# Patient Record
Sex: Female | Born: 1955 | Race: Black or African American | Hispanic: No | Marital: Married | Smoking: Never smoker
Health system: Southern US, Community
[De-identification: ages and names within clinical notes are randomized; demographics above are authoritative.]

## PROBLEM LIST (undated history)

## (undated) DIAGNOSIS — K648 Other hemorrhoids: Secondary | ICD-10-CM

## (undated) DIAGNOSIS — K635 Polyp of colon: Secondary | ICD-10-CM

## (undated) DIAGNOSIS — E785 Hyperlipidemia, unspecified: Secondary | ICD-10-CM

## (undated) DIAGNOSIS — K602 Anal fissure, unspecified: Secondary | ICD-10-CM

## (undated) DIAGNOSIS — T7840XA Allergy, unspecified, initial encounter: Secondary | ICD-10-CM

## (undated) DIAGNOSIS — R0683 Snoring: Secondary | ICD-10-CM

## (undated) DIAGNOSIS — A159 Respiratory tuberculosis unspecified: Secondary | ICD-10-CM

## (undated) DIAGNOSIS — I10 Essential (primary) hypertension: Secondary | ICD-10-CM

## (undated) HISTORY — DX: Anal fissure, unspecified: K60.2

## (undated) HISTORY — PX: COLONOSCOPY: SHX174

## (undated) HISTORY — DX: Snoring: R06.83

## (undated) HISTORY — DX: Polyp of colon: K63.5

## (undated) HISTORY — DX: Essential (primary) hypertension: I10

## (undated) HISTORY — PX: ANAL FISSURE REPAIR: SHX2312

## (undated) HISTORY — PX: COLONOSCOPY W/ POLYPECTOMY: SHX1380

## (undated) HISTORY — DX: Other hemorrhoids: K64.8

## (undated) HISTORY — PX: POLYPECTOMY: SHX149

## (undated) HISTORY — PX: ABDOMINAL HYSTERECTOMY: SHX81

## (undated) HISTORY — DX: Respiratory tuberculosis unspecified: A15.9

## (undated) HISTORY — DX: Allergy, unspecified, initial encounter: T78.40XA

## (undated) HISTORY — DX: Hyperlipidemia, unspecified: E78.5

---

## 1978-12-30 DIAGNOSIS — A159 Respiratory tuberculosis unspecified: Secondary | ICD-10-CM

## 1978-12-30 HISTORY — DX: Respiratory tuberculosis unspecified: A15.9

## 1999-03-30 ENCOUNTER — Other Ambulatory Visit: Admission: RE | Admit: 1999-03-30 | Discharge: 1999-03-30 | Payer: Self-pay | Admitting: Family Medicine

## 2000-12-24 ENCOUNTER — Other Ambulatory Visit: Admission: RE | Admit: 2000-12-24 | Discharge: 2000-12-24 | Payer: Self-pay | Admitting: Internal Medicine

## 2000-12-27 ENCOUNTER — Emergency Department (HOSPITAL_COMMUNITY): Admission: EM | Admit: 2000-12-27 | Discharge: 2000-12-28 | Payer: Self-pay | Admitting: Emergency Medicine

## 2001-07-06 ENCOUNTER — Ambulatory Visit (HOSPITAL_COMMUNITY): Admission: RE | Admit: 2001-07-06 | Discharge: 2001-07-06 | Payer: Self-pay | Admitting: Obstetrics and Gynecology

## 2001-07-06 ENCOUNTER — Encounter: Payer: Self-pay | Admitting: Obstetrics and Gynecology

## 2002-07-03 ENCOUNTER — Emergency Department (HOSPITAL_COMMUNITY): Admission: EM | Admit: 2002-07-03 | Discharge: 2002-07-04 | Payer: Self-pay | Admitting: Emergency Medicine

## 2002-12-30 DIAGNOSIS — K635 Polyp of colon: Secondary | ICD-10-CM

## 2002-12-30 HISTORY — DX: Polyp of colon: K63.5

## 2003-05-02 ENCOUNTER — Other Ambulatory Visit: Admission: RE | Admit: 2003-05-02 | Discharge: 2003-05-02 | Payer: Self-pay | Admitting: Family Medicine

## 2003-05-05 ENCOUNTER — Encounter: Admission: RE | Admit: 2003-05-05 | Discharge: 2003-05-05 | Payer: Self-pay | Admitting: Family Medicine

## 2003-05-05 ENCOUNTER — Encounter: Payer: Self-pay | Admitting: Family Medicine

## 2003-06-24 ENCOUNTER — Inpatient Hospital Stay (HOSPITAL_COMMUNITY): Admission: RE | Admit: 2003-06-24 | Discharge: 2003-06-26 | Payer: Self-pay | Admitting: Obstetrics and Gynecology

## 2003-06-24 ENCOUNTER — Encounter (INDEPENDENT_AMBULATORY_CARE_PROVIDER_SITE_OTHER): Payer: Self-pay | Admitting: *Deleted

## 2004-05-25 ENCOUNTER — Ambulatory Visit (HOSPITAL_COMMUNITY): Admission: RE | Admit: 2004-05-25 | Discharge: 2004-05-25 | Payer: Self-pay | Admitting: Family Medicine

## 2004-08-14 ENCOUNTER — Other Ambulatory Visit: Admission: RE | Admit: 2004-08-14 | Discharge: 2004-08-14 | Payer: Self-pay | Admitting: Obstetrics and Gynecology

## 2004-12-03 ENCOUNTER — Ambulatory Visit: Payer: Self-pay | Admitting: Family Medicine

## 2005-12-06 ENCOUNTER — Ambulatory Visit (HOSPITAL_COMMUNITY): Admission: RE | Admit: 2005-12-06 | Discharge: 2005-12-06 | Payer: Self-pay | Admitting: Family Medicine

## 2005-12-10 ENCOUNTER — Ambulatory Visit: Payer: Self-pay | Admitting: Family Medicine

## 2005-12-10 ENCOUNTER — Encounter: Payer: Self-pay | Admitting: Family Medicine

## 2005-12-10 ENCOUNTER — Other Ambulatory Visit: Admission: RE | Admit: 2005-12-10 | Discharge: 2005-12-10 | Payer: Self-pay | Admitting: Family Medicine

## 2006-01-01 ENCOUNTER — Ambulatory Visit: Payer: Self-pay | Admitting: Internal Medicine

## 2007-02-13 ENCOUNTER — Ambulatory Visit: Payer: Self-pay | Admitting: Family Medicine

## 2007-02-13 LAB — CONVERTED CEMR LAB
Albumin: 4.1 g/dL (ref 3.5–5.2)
Basophils Absolute: 0 10*3/uL (ref 0.0–0.1)
Creatinine, Ser: 0.6 mg/dL (ref 0.4–1.2)
Eosinophils Absolute: 0.2 10*3/uL (ref 0.0–0.6)
GFR calc Af Amer: 136 mL/min
GFR calc non Af Amer: 112 mL/min
HCT: 40 % (ref 36.0–46.0)
Hemoglobin: 13.6 g/dL (ref 12.0–15.0)
Hgb A1c MFr Bld: 6.2 % — ABNORMAL HIGH (ref 4.6–6.0)
Lymphocytes Relative: 41.9 % (ref 12.0–46.0)
MCHC: 34.1 g/dL (ref 30.0–36.0)
MCV: 84.8 fL (ref 78.0–100.0)
Monocytes Absolute: 0.5 10*3/uL (ref 0.2–0.7)
Neutro Abs: 2.1 10*3/uL (ref 1.4–7.7)
Neutrophils Relative %: 44.3 % (ref 43.0–77.0)
Potassium: 3.7 meq/L (ref 3.5–5.1)
Sodium: 141 meq/L (ref 135–145)
TSH: 1.12 microintl units/mL (ref 0.35–5.50)
Total Bilirubin: 0.7 mg/dL (ref 0.3–1.2)

## 2007-02-17 ENCOUNTER — Encounter: Payer: Self-pay | Admitting: Family Medicine

## 2007-02-24 ENCOUNTER — Ambulatory Visit: Payer: Self-pay

## 2007-02-27 ENCOUNTER — Ambulatory Visit: Payer: Self-pay | Admitting: Family Medicine

## 2007-05-04 ENCOUNTER — Ambulatory Visit: Payer: Self-pay | Admitting: Family Medicine

## 2007-05-04 LAB — CONVERTED CEMR LAB
AST: 35 units/L (ref 0–37)
Albumin: 3.6 g/dL (ref 3.5–5.2)
BUN: 10 mg/dL (ref 6–23)
Chloride: 104 meq/L (ref 96–112)
GFR calc non Af Amer: 94 mL/min
Hgb A1c MFr Bld: 6.3 % — ABNORMAL HIGH (ref 4.6–6.0)
Sodium: 139 meq/L (ref 135–145)

## 2007-08-03 ENCOUNTER — Ambulatory Visit: Payer: Self-pay | Admitting: Family Medicine

## 2007-08-10 ENCOUNTER — Encounter: Payer: Self-pay | Admitting: Family Medicine

## 2007-08-14 LAB — CONVERTED CEMR LAB
AST: 54 units/L — ABNORMAL HIGH (ref 0–37)
Albumin: 3.7 g/dL (ref 3.5–5.2)
Alkaline Phosphatase: 94 units/L (ref 39–117)
BUN: 13 mg/dL (ref 6–23)
Chloride: 107 meq/L (ref 96–112)
Creatinine, Ser: 0.7 mg/dL (ref 0.4–1.2)
Total Bilirubin: 0.8 mg/dL (ref 0.3–1.2)

## 2008-02-10 ENCOUNTER — Telehealth (INDEPENDENT_AMBULATORY_CARE_PROVIDER_SITE_OTHER): Payer: Self-pay | Admitting: *Deleted

## 2008-03-09 ENCOUNTER — Telehealth: Payer: Self-pay | Admitting: Family Medicine

## 2008-06-10 ENCOUNTER — Ambulatory Visit (HOSPITAL_COMMUNITY): Admission: RE | Admit: 2008-06-10 | Discharge: 2008-06-10 | Payer: Self-pay | Admitting: Family Medicine

## 2008-06-24 ENCOUNTER — Encounter: Payer: Self-pay | Admitting: Family Medicine

## 2008-06-24 ENCOUNTER — Encounter: Payer: Self-pay | Admitting: Internal Medicine

## 2008-06-24 ENCOUNTER — Other Ambulatory Visit: Admission: RE | Admit: 2008-06-24 | Discharge: 2008-06-24 | Payer: Self-pay | Admitting: Family Medicine

## 2008-06-24 ENCOUNTER — Ambulatory Visit: Payer: Self-pay | Admitting: Family Medicine

## 2008-06-24 DIAGNOSIS — E785 Hyperlipidemia, unspecified: Secondary | ICD-10-CM

## 2008-06-24 DIAGNOSIS — K921 Melena: Secondary | ICD-10-CM | POA: Insufficient documentation

## 2008-06-27 ENCOUNTER — Encounter (INDEPENDENT_AMBULATORY_CARE_PROVIDER_SITE_OTHER): Payer: Self-pay | Admitting: *Deleted

## 2008-06-28 ENCOUNTER — Encounter: Payer: Self-pay | Admitting: Family Medicine

## 2008-06-29 ENCOUNTER — Encounter (INDEPENDENT_AMBULATORY_CARE_PROVIDER_SITE_OTHER): Payer: Self-pay | Admitting: *Deleted

## 2008-06-30 ENCOUNTER — Ambulatory Visit: Payer: Self-pay | Admitting: Family Medicine

## 2008-06-30 ENCOUNTER — Telehealth (INDEPENDENT_AMBULATORY_CARE_PROVIDER_SITE_OTHER): Payer: Self-pay | Admitting: *Deleted

## 2008-06-30 LAB — CONVERTED CEMR LAB: OCCULT 3: NEGATIVE

## 2008-07-04 ENCOUNTER — Encounter (INDEPENDENT_AMBULATORY_CARE_PROVIDER_SITE_OTHER): Payer: Self-pay | Admitting: *Deleted

## 2008-10-04 ENCOUNTER — Telehealth (INDEPENDENT_AMBULATORY_CARE_PROVIDER_SITE_OTHER): Payer: Self-pay | Admitting: *Deleted

## 2008-10-13 ENCOUNTER — Ambulatory Visit: Payer: Self-pay | Admitting: Family Medicine

## 2008-10-14 ENCOUNTER — Encounter (INDEPENDENT_AMBULATORY_CARE_PROVIDER_SITE_OTHER): Payer: Self-pay | Admitting: *Deleted

## 2008-10-31 ENCOUNTER — Encounter (INDEPENDENT_AMBULATORY_CARE_PROVIDER_SITE_OTHER): Payer: Self-pay | Admitting: *Deleted

## 2009-01-23 ENCOUNTER — Ambulatory Visit: Payer: Self-pay | Admitting: Family Medicine

## 2009-01-24 ENCOUNTER — Encounter: Payer: Self-pay | Admitting: Family Medicine

## 2009-01-25 ENCOUNTER — Encounter (INDEPENDENT_AMBULATORY_CARE_PROVIDER_SITE_OTHER): Payer: Self-pay | Admitting: *Deleted

## 2009-03-24 ENCOUNTER — Telehealth (INDEPENDENT_AMBULATORY_CARE_PROVIDER_SITE_OTHER): Payer: Self-pay | Admitting: *Deleted

## 2009-06-02 ENCOUNTER — Ambulatory Visit: Payer: Self-pay | Admitting: Family Medicine

## 2009-06-07 ENCOUNTER — Encounter (INDEPENDENT_AMBULATORY_CARE_PROVIDER_SITE_OTHER): Payer: Self-pay | Admitting: *Deleted

## 2009-06-12 ENCOUNTER — Encounter (INDEPENDENT_AMBULATORY_CARE_PROVIDER_SITE_OTHER): Payer: Self-pay | Admitting: *Deleted

## 2009-06-16 ENCOUNTER — Ambulatory Visit: Payer: Self-pay | Admitting: Family Medicine

## 2009-06-19 ENCOUNTER — Encounter (INDEPENDENT_AMBULATORY_CARE_PROVIDER_SITE_OTHER): Payer: Self-pay | Admitting: *Deleted

## 2009-10-06 ENCOUNTER — Ambulatory Visit: Payer: Self-pay | Admitting: Family Medicine

## 2009-10-09 ENCOUNTER — Encounter: Payer: Self-pay | Admitting: Family Medicine

## 2009-10-13 ENCOUNTER — Ambulatory Visit: Payer: Self-pay | Admitting: Family Medicine

## 2009-10-25 ENCOUNTER — Telehealth (INDEPENDENT_AMBULATORY_CARE_PROVIDER_SITE_OTHER): Payer: Self-pay | Admitting: *Deleted

## 2010-01-26 ENCOUNTER — Ambulatory Visit: Payer: Self-pay | Admitting: Family Medicine

## 2010-01-26 ENCOUNTER — Ambulatory Visit (HOSPITAL_COMMUNITY): Admission: RE | Admit: 2010-01-26 | Discharge: 2010-01-26 | Payer: Self-pay | Admitting: Family Medicine

## 2010-01-29 ENCOUNTER — Telehealth (INDEPENDENT_AMBULATORY_CARE_PROVIDER_SITE_OTHER): Payer: Self-pay | Admitting: *Deleted

## 2010-01-30 ENCOUNTER — Encounter: Payer: Self-pay | Admitting: Family Medicine

## 2010-04-27 ENCOUNTER — Encounter (INDEPENDENT_AMBULATORY_CARE_PROVIDER_SITE_OTHER): Payer: Self-pay | Admitting: *Deleted

## 2010-06-15 ENCOUNTER — Ambulatory Visit: Payer: Self-pay | Admitting: Family Medicine

## 2010-06-15 DIAGNOSIS — M899 Disorder of bone, unspecified: Secondary | ICD-10-CM | POA: Insufficient documentation

## 2010-06-15 DIAGNOSIS — M949 Disorder of cartilage, unspecified: Secondary | ICD-10-CM

## 2010-08-17 ENCOUNTER — Encounter: Payer: Self-pay | Admitting: Family Medicine

## 2010-10-05 ENCOUNTER — Ambulatory Visit: Payer: Self-pay | Admitting: Family Medicine

## 2010-10-10 ENCOUNTER — Telehealth: Payer: Self-pay | Admitting: Family Medicine

## 2010-10-10 LAB — CONVERTED CEMR LAB
ALT: 15 units/L (ref 0–35)
Albumin: 3.9 g/dL (ref 3.5–5.2)
Alkaline Phosphatase: 60 units/L (ref 39–117)
Cholesterol: 113 mg/dL (ref 0–200)
LDL Cholesterol: 57 mg/dL (ref 0–99)
Total Protein: 7.5 g/dL (ref 6.0–8.3)
Triglycerides: 99 mg/dL (ref 0.0–149.0)

## 2010-11-26 ENCOUNTER — Encounter: Payer: Self-pay | Admitting: Family Medicine

## 2010-11-30 ENCOUNTER — Telehealth (INDEPENDENT_AMBULATORY_CARE_PROVIDER_SITE_OTHER): Payer: Self-pay | Admitting: *Deleted

## 2011-01-27 LAB — CONVERTED CEMR LAB
ALT: 17 units/L (ref 0–35)
ALT: 20 units/L (ref 0–35)
ALT: 20 units/L (ref 0–35)
ALT: 22 units/L (ref 0–35)
AST: 22 units/L (ref 0–37)
AST: 24 units/L (ref 0–37)
AST: 26 units/L (ref 0–37)
Albumin: 3.7 g/dL (ref 3.5–5.2)
Albumin: 3.9 g/dL (ref 3.5–5.2)
Albumin: 3.9 g/dL (ref 3.5–5.2)
Alkaline Phosphatase: 60 units/L (ref 39–117)
BUN: 13 mg/dL (ref 6–23)
Basophils Absolute: 0 10*3/uL (ref 0.0–0.1)
Basophils Relative: 0.3 % (ref 0.0–3.0)
Basophils Relative: 0.3 % (ref 0.0–3.0)
Basophils Relative: 0.9 % (ref 0.0–1.0)
Bilirubin Urine: NEGATIVE
Bilirubin Urine: NEGATIVE
Bilirubin, Direct: 0.1 mg/dL (ref 0.0–0.3)
Bilirubin, Direct: 0.1 mg/dL (ref 0.0–0.3)
Bilirubin, Direct: 0.1 mg/dL (ref 0.0–0.3)
Bilirubin, Direct: 0.2 mg/dL (ref 0.0–0.3)
Blood in Urine, dipstick: NEGATIVE
CO2: 28 meq/L (ref 19–32)
CO2: 31 meq/L (ref 19–32)
CO2: 31 meq/L (ref 19–32)
CRP, High Sensitivity: 2 (ref 0.00–5.00)
Calcium: 8.8 mg/dL (ref 8.4–10.5)
Calcium: 9.2 mg/dL (ref 8.4–10.5)
Chloride: 102 meq/L (ref 96–112)
Chloride: 104 meq/L (ref 96–112)
Chloride: 106 meq/L (ref 96–112)
Cholesterol: 107 mg/dL (ref 0–200)
Creatinine, Ser: 0.8 mg/dL (ref 0.4–1.2)
Creatinine, Ser: 0.8 mg/dL (ref 0.4–1.2)
Eosinophils Absolute: 0.1 10*3/uL (ref 0.0–0.7)
Eosinophils Absolute: 0.1 10*3/uL (ref 0.0–0.7)
Eosinophils Relative: 2.5 % (ref 0.0–5.0)
Glucose, Bld: 100 mg/dL — ABNORMAL HIGH (ref 70–99)
Glucose, Bld: 124 mg/dL — ABNORMAL HIGH (ref 70–99)
Glucose, Urine, Semiquant: NEGATIVE
HCT: 38.9 % (ref 36.0–46.0)
Hemoglobin: 13 g/dL (ref 12.0–15.0)
Hemoglobin: 13.2 g/dL (ref 12.0–15.0)
Ketones, urine, test strip: NEGATIVE
Ketones, urine, test strip: NEGATIVE
Lymphocytes Relative: 39 % (ref 12.0–46.0)
Lymphocytes Relative: 40.8 % (ref 12.0–46.0)
MCHC: 33.4 g/dL (ref 30.0–36.0)
MCHC: 33.4 g/dL (ref 30.0–36.0)
MCHC: 33.6 g/dL (ref 30.0–36.0)
MCV: 86.5 fL (ref 78.0–100.0)
MCV: 88.2 fL (ref 78.0–100.0)
Monocytes Absolute: 0.7 10*3/uL (ref 0.1–1.0)
Monocytes Relative: 13.5 % — ABNORMAL HIGH (ref 3.0–12.0)
Neutro Abs: 1.8 10*3/uL (ref 1.4–7.7)
Neutro Abs: 2.2 10*3/uL (ref 1.4–7.7)
Neutrophils Relative %: 34.6 % — ABNORMAL LOW (ref 43.0–77.0)
Neutrophils Relative %: 44.1 % (ref 43.0–77.0)
Platelets: 161 10*3/uL (ref 150.0–400.0)
Potassium: 3.7 meq/L (ref 3.5–5.1)
Protein, U semiquant: NEGATIVE
RBC: 4.49 M/uL (ref 3.87–5.11)
RBC: 4.59 M/uL (ref 3.87–5.11)
Sodium: 140 meq/L (ref 135–145)
Specific Gravity, Urine: 1.015
TSH: 1.37 microintl units/mL (ref 0.35–5.50)
Total Bilirubin: 0.7 mg/dL (ref 0.3–1.2)
Total Bilirubin: 0.8 mg/dL (ref 0.3–1.2)
Total Bilirubin: 0.8 mg/dL (ref 0.3–1.2)
Total Bilirubin: 0.9 mg/dL (ref 0.3–1.2)
Total Protein: 7.4 g/dL (ref 6.0–8.3)
Total Protein: 7.4 g/dL (ref 6.0–8.3)
Total Protein: 7.7 g/dL (ref 6.0–8.3)
Total Protein: 7.8 g/dL (ref 6.0–8.3)
Triglycerides: 79 mg/dL (ref 0.0–149.0)
Urobilinogen, UA: 0.2
pH: 5
pH: 6

## 2011-01-29 NOTE — Medication Information (Signed)
Summary: Nonadherence with Crestor/[employer]  Nonadherence with Crestor/[employer]   Imported By: Lanelle Bal 09/04/2010 11:25:07  _____________________________________________________________________  External Attachment:    Type:   Image     Comment:   External Document

## 2011-01-29 NOTE — Progress Notes (Signed)
Summary: -Prior Auth required alternative med  Phone Note Refill Request   Refills Requested: Medication #1:  CRESTOR 10 MG TABS Take 1 tab at bedtime  Prior Auth  required for crestor. Pt must have tried and failed: lovastatin, pravastatin, simvastatin or lipitor in order for insurance to pay for Crestor. Pls advise...............Marland KitchenFelecia Deloach CMA  November 30, 2010 8:59 AM    Follow-up for Phone Call        change to Lipitor 10 mg #30  1 by mouth at bedtime  2 refills Follow-up by: Loreen Freud DO,  November 30, 2010 11:34 AM  Additional Follow-up for Phone Call Additional follow up Details #1::        left message to call office...........Marland KitchenFelecia Deloach CMA  November 30, 2010 12:08 PM     Additional Follow-up for Phone Call Additional follow up Details #2::    Pt aware.............Marland KitchenFelecia Deloach CMA  November 30, 2010 12:50 PM   New/Updated Medications: LIPITOR 10 MG TABS (ATORVASTATIN CALCIUM) Take 1 by mouth at bedtime NIASPAN 500 MG CR-TABS (NIACIN (ANTIHYPERLIPIDEMIC)) Take 1 tab once daily Prescriptions: NIASPAN 500 MG CR-TABS (NIACIN (ANTIHYPERLIPIDEMIC)) Take 1 tab once daily  #30 x 2   Entered by:   Jeremy Johann CMA   Authorized by:   Loreen Freud DO   Signed by:   Jeremy Johann CMA on 11/30/2010   Method used:   Faxed to ...       [facility]  [address] [id number]* (retail)       [address]       [place], [place]  [address]       Ph: [phone] or [phone]       Fax: [phone]   RxID:   [id number] LIPITOR 10 MG TABS (ATORVASTATIN CALCIUM) Take 1 by mouth at bedtime  #30 x 2   Entered by:   Jeremy Johann CMA   Authorized by:   Loreen Freud DO   Signed by:   Jeremy Johann CMA on 11/30/2010   Method used:   Faxed to ...       [facility]  [address] [id number]* (retail)       [address]       [place], [place]  [address]       Ph: [phone] or [phone]       Fax: [phone]   RxID:    [id number]

## 2011-01-29 NOTE — Letter (Signed)
Summary: Colonoscopy Date Change Letter  [facility]  [address] [place], [place] [address]   Phone: [phone]  Fax: [phone]      April 27, 2010 MRN: [id number]   Joann Townsend [address] [place], [place]  [address]   Dear Ms. Aguallo,   Previously you were recommended to have a repeat colonoscopy around this time. Your chart was recently reviewed by Dr. Judie Petit T. Russella Dar of [facility]. Follow up colonoscopy is now recommended in May 2014. This revised recommendation is based on current, nationally recognized guidelines for colorectal cancer screening and polyp surveillance. These guidelines are endorsed by the American Cancer Society, [employer] on Colorectal Cancer as well as numerous other major medical organizations.  Please understand that our recommendation assumes that you do not have any new symptoms such as bleeding, a change in bowel habits, anemia, or significant abdominal discomfort. If you do have any concerning GI symptoms or want to discuss the guideline recommendations, please call to arrange an office visit at your earliest convenience. Otherwise we will keep you in our reminder system and contact you 1-2 months prior to the date listed above to schedule your next colonoscopy.  Thank you,  Judie Petit T. Russella Dar, M.D.  [facility] [phone]

## 2011-01-29 NOTE — Progress Notes (Signed)
Summary: Imaging Results   Phone Note Outgoing Call   Call placed by: Army Fossa CMA,  January 29, 2010 8:51 AM Summary of Call: Regarding Imaging Results, LMTCB:  no acute findings Signed by Loreen Freud DO on 01/26/2010 at 5:33 PM  Follow-up for Phone Call        pt is aware. Army Fossa CMA  January 29, 2010 4:20 PM

## 2011-01-29 NOTE — Assessment & Plan Note (Signed)
Summary: CPX AND FASTING LABS///[facility]   Vital Signs:  Patient profile:   55 year old female Height:      60 inches Weight:      164.50 pounds BMI:     32.24 Pulse rate:   76 / minute Pulse rhythm:   regular BP sitting:   122 / 82  (left arm) Cuff size:   regular  Vitals Entered By: Army Fossa CMA (June 15, 2010 8:34 AM) CC: Pt here for CPX, pt is fasting   History of Present Illness: Pt here for cpe and labs.   No complaints.    Preventive Screening-Counseling & Management  Alcohol-Tobacco     Alcohol drinks/day: 0     Smoking Status: never  Caffeine-Diet-Exercise     Caffeine use/day: 0     Does Patient Exercise: yes     Type of exercise: treadmill---not consistant     Exercise (avg: min/session): 30-60     Times/week: 3     Exercise Counseling: to improve exercise regimen  Hep-HIV-STD-Contraception     Dental Visit-last 6 months no     Dental Care Counseling: to seek dental care; no dental care within six months     SBE monthly: yes     SBE Education/Counseling: to perform regular SBE  Safety-Violence-Falls     Seat Belt Use: yes     Seat Belt Counseling: not indicated; patient wears seat belts     Firearms in the Home: no firearms in the home     Firearm Counseling: not applicable     Smoke Detectors: yes     Violence in the Home: no risk noted     Sexual Abuse: no      Sexual History:  currently monogamous.        Drug Use:  never.    Current Medications (verified): 1)  Climara 0.075 Mg/24hr  Ptwk (Estradiol) .... Apply 1 Patch Weekly 2)  Crestor 20 Mg  Tabs (Rosuvastatin Calcium) .... Take One and 1/2  Tablet Each Evening At Bedtime. 3)  Niaspan 500 Mg Cr-Tabs (Niacin (Antihyperlipidemic)) .Marland Kitchen.. 1 By Mouth At Bedtime. 4)  Niaspan 1000 Mg Cr-Tabs (Niacin (Antihyperlipidemic)) .Marland Kitchen.. 1 By Mouth Once Daily.  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 mother-htn brother-mother-dm Family History Diabetes 1st degree relative Family History Hypertension  Social History: Reviewed history from 06/24/2008 and no changes required. 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 Eyes:  Complains of blurring; denies discharge, double vision, eye irritation, eye pain, halos, itching, light sensitivity, red eye, vision loss-1 eye, and vision loss-both eyes; ophth-- q1y. ENT:  Denies decreased hearing, difficulty swallowing, ear discharge, earache, hoarseness, nasal congestion, nosebleeds, postnasal  drainage, ringing in ears, sinus pressure, and sore throat. CV:  Denies bluish discoloration of lips or nails, chest pain or discomfort, difficulty breathing at night, difficulty breathing while lying down, fainting, fatigue, leg cramps with exertion, lightheadness, near fainting, palpitations, shortness of breath with exertion, swelling of feet, swelling of hands, and weight gain. Resp:  Denies chest discomfort, chest pain with  inspiration, cough, coughing up blood, excessive snoring, hypersomnolence, morning headaches, pleuritic, shortness of breath, sputum productive, and wheezing. GI:  Denies abdominal pain, bloody stools, change in bowel habits, constipation, dark tarry stools, diarrhea, excessive appetite, gas, hemorrhoids, indigestion, loss of appetite, nausea, vomiting, vomiting blood, and yellowish skin color. GU:  Denies abnormal vaginal bleeding, decreased libido, discharge, dysuria, genital sores, hematuria, incontinence, nocturia, urinary frequency, and urinary hesitancy. MS:  Denies joint pain, joint redness, joint swelling, loss of strength, low back pain, mid back pain, muscle aches, muscle , cramps, muscle weakness, stiffness, and thoracic pain. Derm:  Denies changes in color of skin, changes in nail beds, dryness, excessive perspiration, flushing, hair loss, insect bite(s), itching, lesion(s), poor wound healing, and rash. Neuro:  Denies brief paralysis, difficulty with concentration, disturbances in coordination, falling down, headaches, inability to speak, memory loss, numbness, poor balance, seizures, sensation of room spinning, tingling, tremors, visual disturbances, and weakness. Psych:  Denies alternate hallucination ( auditory/visual), anxiety, depression, easily angered, easily tearful, irritability, mental problems, panic attacks, sense of great danger, suicidal thoughts/plans, thoughts of violence, unusual visions or sounds, and thoughts /plans of harming others. Endo:  Denies cold intolerance, excessive hunger, excessive thirst, excessive urination, heat intolerance, polyuria, and weight change. Heme:  Denies abnormal bruising, bleeding, enlarge lymph nodes, fevers, pallor, and skin discoloration. Allergy:  Denies hives or rash, itching eyes, persistent infections, seasonal allergies, and sneezing.  Physical Exam  General:  Well-developed,well-nourished,in no acute distress; alert,appropriate and  cooperative throughout examination Head:  Normocephalic and atraumatic without obvious abnormalities. No apparent alopecia or balding. Eyes:  vision grossly intact, pupils equal, pupils round, pupils reactive to light, and no injection.   Ears:  External ear exam shows no significant lesions or deformities.  Otoscopic examination reveals clear canals, tympanic membranes are intact bilaterally without bulging, retraction, inflammation or discharge. Hearing is grossly normal bilaterally. Nose:  External nasal examination shows no deformity or inflammation. Nasal mucosa are pink and moist without lesions or exudates. Mouth:  Oral mucosa and oropharynx without lesions or exudates.  Teeth in good repair. Neck:  No deformities, masses, or tenderness noted.no carotid bruits.   Chest Wall:  No deformities, masses, or tenderness noted. Lungs:  Normal respiratory effort, chest expands symmetrically. Lungs are clear to auscultation, no crackles or wheezes. Heart:  normal rate and no murmur.   Abdomen:  Bowel sounds positive,abdomen soft and non-tender without masses, organomegaly or hernias noted. Rectal:  deferred Genitalia:  pap deferred --TAH Msk:  normal ROM, no joint tenderness, no joint swelling, no joint warmth, no redness over joints, no joint deformities, no joint instability, and no crepitation.   Pulses:  R posterior tibial normal, R dorsalis pedis normal, R carotid normal, L posterior tibial normal, L dorsalis pedis normal, and L carotid normal.   Extremities:  No clubbing, cyanosis, edema, or deformity noted with normal full range of motion of all joints.   Neurologic:  No cranial nerve deficits noted. Station and gait are normal. Plantar reflexes are down-going bilaterally. DTRs are symmetrical throughout. Sensory, motor and coordinative functions appear intact. Skin:  Intact without suspicious lesions or rashes  Cervical Nodes:  No lymphadenopathy noted Axillary Nodes:  No palpable  lymphadenopathy Psych:  Oriented X3 and normally interactive.     Impression & Recommendations:  Problem # 1:  PREVENTIVE HEALTH CARE (ICD-V70.0)  Orders: Venipuncture (29518) TLB-Lipid Panel (80061-LIPID) TLB-BMP (Basic Metabolic Panel-BMET) (80048-METABOL) TLB-CBC Platelet - w/Differential (85025-CBCD) TLB-Hepatic/Liver Function Pnl (80076-HEPATIC) TLB-TSH (Thyroid Stimulating Hormone) (84443-TSH) EKG w/ Interpretation (93000)  Problem # 2:  HYPERLIPIDEMIA (ICD-272.4)  Her updated medication list for this problem includes:    Crestor 20 Mg Tabs (Rosuvastatin calcium) .Marland Kitchen... Take one and 1/2  tablet each evening at bedtime.    Niaspan 500 Mg Cr-tabs (Niacin (antihyperlipidemic)) .Marland Kitchen... 1 by mouth at bedtime.    Niaspan 1000 Mg Cr-tabs (Niacin (antihyperlipidemic)) .Marland Kitchen... 1 by mouth once daily.  Orders: Venipuncture (84166) TLB-Lipid Panel (80061-LIPID) TLB-BMP (Basic Metabolic Panel-BMET) (80048-METABOL) TLB-CBC Platelet - w/Differential (85025-CBCD) TLB-Hepatic/Liver Function Pnl (80076-HEPATIC) TLB-TSH (Thyroid Stimulating Hormone) (84443-TSH) EKG w/ Interpretation (93000)  Problem # 3:  POSITIVE PPD (ICD-795.5)  cxr in jan --nothing acute--old gran dz  Orders: EKG w/ Interpretation (93000)  Problem # 4:  OSTEOPENIA (ICD-733.90)  Orders: Radiology Referral (Radiology) EKG w/ Interpretation (93000)  Complete Medication List: 1)  Climara 0.075 Mg/24hr Ptwk (Estradiol) .... Apply 1 patch weekly 2)  Crestor 20 Mg Tabs (Rosuvastatin calcium) .... Take one and 1/2  tablet each evening at bedtime. 3)  Niaspan 500 Mg Cr-tabs (Niacin (antihyperlipidemic)) .Marland Kitchen.. 1 by mouth at bedtime. 4)  Niaspan 1000 Mg Cr-tabs (Niacin (antihyperlipidemic)) .Marland Kitchen.. 1 by mouth once daily.   EKG  Procedure date:  06/15/2010  Findings:      borderline 1st degree av block Normal sinus rhythm with rate of:  63 bpm     Flu Vaccine Next Due:  Refused Last Colonoscopy:  polyps (05/17/2003  9:01:59 AM) Colonoscopy Result Date:  05/17/2003 Colonoscopy Result:  polyps Colonoscopy Next Due:  10 yr PAP Next Due:  Refused Last Mammogram:  ASSESSMENT: Negative - BI-RADS 1^MM DIGITAL SCREENING (01/26/2010 1:01:00 PM) Mammogram Result Date:  01/26/2010 Mammogram Result:  normal Mammogram Next Due:  1 yr       Laboratory Results   Urine Tests    Routine Urinalysis   Color: yellow Appearance: Clear Glucose: negative   (Normal Range: Negative) Bilirubin: negative   (Normal Range: Negative) Ketone: negative   (Normal Range: Negative) Spec. Gravity: 1.015   (Normal Range: 1.003-1.035) Blood: negative   (Normal Range: Negative) pH: 6.0   (Normal Range: 5.0-8.0) Protein: negative   (Normal Range: Negative) Urobilinogen: 0.2   (Normal Range: 0-1) Nitrite: negative   (Normal Range: Negative) Leukocyte Esterace: negative   (Normal Range: Negative)    Comments: Army Fossa CMA  June 15, 2010 11:07 AM

## 2011-01-29 NOTE — Progress Notes (Signed)
Summary: Lab Result   Phone Note Outgoing Call   Call placed by: Jeremy Johann CMA,  October 10, 2010 9:59 AM Details for Reason: deally your LDL (bad cholesterol) should be <_100___, your HDL (good cholesterol) should be >__40_ and your triglycerides should be< 150.  Diet and exercise will increase HDL and decrease the LDL and triglycerides.  LDL is very low----decrease crestor 10 mg #30 1 by mouth  at bedtime, 2 refills.  We will recheck labs in _3__ months.  272.4  [facility] Summary of Call: left message to call office................Marland KitchenFelecia Deloach CMA  October 10, 2010 9:59 AM   spk with pt and advise her her lab work. Adv we will decrease the crestor to 10mg  instead of 30mg , pt wanted to know if she needs to continue the 1000 mg of Niaspan or will you lke to decrease that as well...Marland KitchenMarland Kitchen please advise c/b [id number] Initial call taken by: Almeta Monas CMA Duncan Dull),  October 10, 2010 1:51 PM  Follow-up for Phone Call        no ---leave the niaspan the same for now unless she is having a problem with it Follow-up by: Loreen Freud DO,  October 11, 2010 9:07 AM  Additional Follow-up for Phone Call Additional follow up Details #1::        Left message to call back  Almeta Monas CMA Duncan Dull)  October 11, 2010 9:10 AM   --Pt states that she has decrease med on her own to 1/2 (500 mg) tab of niaspan due to 1000mg  causing body pain when taken everyday.............Marland KitchenFelecia Deloach CMA  October 11, 2010 3:08 PM     Additional Follow-up for Phone Call Additional follow up Details #2::    ok Follow-up by: Loreen Freud DO,  October 11, 2010 3:29 PM  Additional Follow-up for Phone Call Additional follow up Details #3:: Details for Additional Follow-up Action Taken: Pt aware.................Marland KitchenFelecia Deloach CMA  October 11, 2010 4:58 PM   New/Updated Medications: NIASPAN 1000 MG CR-TABS (NIACIN (ANTIHYPERLIPIDEMIC)) Take 1/2  by mouth once daily.

## 2011-01-31 NOTE — Medication Information (Signed)
Summary: Formulary Letter Regarding Crestor/[employer]  Formulary Letter Regarding Crestor/[employer]   Imported By: Lanelle Bal 12/20/2010 12:06:49  _____________________________________________________________________  External Attachment:    Type:   Image     Comment:   External Document

## 2011-03-20 ENCOUNTER — Encounter: Payer: Self-pay | Admitting: Family Medicine

## 2011-03-20 ENCOUNTER — Ambulatory Visit (INDEPENDENT_AMBULATORY_CARE_PROVIDER_SITE_OTHER): Payer: Managed Care, Other (non HMO) | Admitting: Family Medicine

## 2011-03-20 DIAGNOSIS — R079 Chest pain, unspecified: Secondary | ICD-10-CM | POA: Insufficient documentation

## 2011-03-20 DIAGNOSIS — R030 Elevated blood-pressure reading, without diagnosis of hypertension: Secondary | ICD-10-CM

## 2011-03-20 DIAGNOSIS — I10 Essential (primary) hypertension: Secondary | ICD-10-CM | POA: Insufficient documentation

## 2011-03-20 NOTE — Assessment & Plan Note (Signed)
Pt's pain doesn't appear to be cardiac in nature.  Given that she has reproducible chest wall tenderness and pain improves w/ breast support it is most likely musculoskeletal.  Reviewed importance of sports bra during exercise.  Reviewed supportive care and red flags that should prompt return.  Pt expressed understanding and is in agreement w/ plan.

## 2011-03-20 NOTE — Patient Instructions (Signed)
Please follow up in 2-4 weeks to recheck your blood pressure Your chest pain does not seem to be related to your heart- it seems to be muscular Wear a supportive sports bra during exercise Tylenol or ibuprofen for pain If you have increased chest pressure, shortness of breath, nausea, or other concerns- please go to the ER Call with any questions or concerns Hang in there!

## 2011-03-20 NOTE — Progress Notes (Signed)
  Subjective:    Patient ID: Joann Townsend, female    DOB: [birth year], 55 y.o.   MRN: [id number]  HPI CP- reports that for last month she has been having pain.  Has recently started exercising w/ personal trainer and wasn't sure if pain was exercise related.  No current pain.  Pain is intermittant- doesn't occur every time pt exercises.  Pain is located in L breast.  No pain w/out exercise.  Will occasionally have pain w/ movement.  Denies associated SOB, diaphoresis, nausea.  Has been exercising w/out a bra and notices when she supports her breasts the pain improves.  Elevated BP- no hx of HTN.  Had unwanted meeting at work, 1-on-1 coaching session w/ boss, anxious about coming in.  Denies SOB, HAs, visual changes, edema.   Review of Systems For ROS see HPI    Objective:   Physical Exam  Constitutional: She is oriented to person, place, and time. She appears well-developed and well-nourished. No distress.  HENT:  Head: Normocephalic and atraumatic.  Eyes: Conjunctivae and EOM are normal. Pupils are equal, round, and reactive to light.  Neck: Normal range of motion. Neck supple.  Cardiovascular: Normal rate, regular rhythm, normal heart sounds and intact distal pulses.   No murmur heard. Pulmonary/Chest: Effort normal and breath sounds normal. No respiratory distress. She exhibits tenderness.       + L chest wall tenderness over breast suspensory ligaments  Abdominal: Soft. Bowel sounds are normal. She exhibits no distension. There is no tenderness. There is no rebound and no guarding.  Musculoskeletal: She exhibits no edema.  Neurological: She is alert and oriented to person, place, and time.  Skin: Skin is warm and dry. She is not diaphoretic.  Psychiatric: She has a normal mood and affect. Her behavior is normal. Judgment and thought content normal.          Assessment & Plan:

## 2011-03-22 ENCOUNTER — Other Ambulatory Visit: Payer: Managed Care, Other (non HMO)

## 2011-04-15 ENCOUNTER — Other Ambulatory Visit: Payer: Self-pay

## 2011-04-15 MED ORDER — ATORVASTATIN CALCIUM 10 MG PO TABS: 10.0000 mg | ORAL_TABLET | Freq: Every day | ORAL | Status: DC

## 2011-04-15 MED ORDER — NIACIN ER (ANTIHYPERLIPIDEMIC) 500 MG PO TBCR: 500.0000 mg | EXTENDED_RELEASE_TABLET | Freq: Every day | ORAL | Status: DC

## 2011-04-26 ENCOUNTER — Encounter: Payer: Self-pay | Admitting: Family Medicine

## 2011-05-02 ENCOUNTER — Ambulatory Visit (INDEPENDENT_AMBULATORY_CARE_PROVIDER_SITE_OTHER): Payer: BC Managed Care – PPO | Admitting: Family Medicine

## 2011-05-02 ENCOUNTER — Encounter: Payer: Self-pay | Admitting: Family Medicine

## 2011-05-02 DIAGNOSIS — E785 Hyperlipidemia, unspecified: Secondary | ICD-10-CM

## 2011-05-02 NOTE — Progress Notes (Signed)
  Subjective:    Patient ID: Joann Townsend, female    DOB: 1956-03-01, 55 y.o.   MRN: [id number]  HPI Pt here to go over [facility].  No complaints.    Review of Systems As above    Objective:   Physical Exam  Constitutional: She appears well-developed and well-nourished.  Psychiatric: She has a normal mood and affect. Her behavior is normal. Judgment and thought content normal.          Assessment & Plan:

## 2011-05-02 NOTE — Assessment & Plan Note (Signed)
con't lipitor and niaspan Pt would like to wait on inc niaspan Recheck 6 months

## 2011-05-02 NOTE — Patient Instructions (Signed)
Cholesterol Control  Modern scientists have known for a long time that cholesterol levels in the body are related to coronary heart disease and that diet affects these levels. The following material helps to explain this relationship and discusses what you can do to help keep your heart healthy. Not all cholesterol is bad. Low-density lipoprotein (LDL) cholesterol is the "bad" cholesterol which may cause fatty deposits to build up inside your arteries. High-density lipoprotein (HDL) cholesterol is "good". It helps to remove the "bad" LDL cholesterol from your blood. Cholesterol is a very important risk factor for coronary heart disease. Other risk factors are high blood pressure, smoking, stress, heredity, and weight.  The heart muscle gets its supply of blood through the coronary arteries. If your LDL ("bad") cholesterol is high and your HDL ("good") cholesterol is low, you have a risk factor for fatty deposits accumulating in your coronary arteries (a vessel providing blood to the heart). This leaves less room through which blood can flow. Without sufficient blood and the oxygen, the heart muscle cannot function properly, and you may feel chest pains (called angina pectoris). When a coronary artery closes up entirely, a part of the heart muscle may die (called a myocardial infarction).  CHECKING CHOLESTEROL  When your caregiver sends your blood to a lab to be analyzed for cholesterol, they may do a complete lipid (fat) profile. With this test the total amount of cholesterol, as well as levels of LDL and HDL, are determined. The chart below describes what the numbers should be:  Test  Goal    Total Cholesterol  Less than 200 mg/dl    LDL "bad cholesterol"  Less than 160 mg/dl for those at low risk for heart disease  Less than 130 mg/dl for those at intermediate risk for heart disease.  Less than 100 mg/dl for those with diabetes or at high risk for heart disease   Less than 70 mg/dl for those at very high risk for heart disease    HDL "good cholesterol"  Women: Greater than 50 mg/dl  Men: Greater than 40 mg/dl    Triglycerides  Less than 150 mg/dl    Reference: AmericanHeart.org/Numbersthatcount 01/13/07  CONTROLLING CHOLESTEROL WITH DIET  Although such factors as exercise and life-style are important, the "first line of attack" is diet. That is because certain foods are known to raise cholesterol and others to lower it. So the goal for most Americans is to balance foods for their effect on cholesterol and, even more important, to replace saturated fat with other types of fat, such as monounsaturated and polyunsaturated fats, and omega-3 fatty acids.  The average American takes in about 500 to 600 milligrams (mg) of cholesterol and about 40 grams (g) of saturated fat daily. Ideally, these figures should be reduced to 300 mg of cholesterol and 15 to 17 g of saturated fat, or even lower in people who have coronary artery disease or a history of heart attack. But that does not mean you have to sacrifice all your favorite foods. Today, as the table at the end of this document shows, there are good-tasting, low-fat, low-cholesterol substitutes for most of the things you like to eat. Which foods should you choose? Choose low-fat or non-fat alternatives. Choose round or loin cuts of red meat as these types of cuts are lowest in fat and cholesterol. Chicken (without the skin), fish, veal and ground turkey breast are excellent choices. Eliminate fatty meats like hotdogs and salami. Even shellfish have little or no saturated   fat and so, despite their high cholesterol content, are allowable in moderation. When you eat lean meat, poultry, or fish, have a 3 oz. portion.   For baking and cooking, oils are an excellent substitute for butter. The monounsaturated oils are of particular benefit since it is believed they lower LDL (the bad cholesterol) but raise HDL. The oils you should avoid entirely are the saturated tropical oils such as coconut and palm.    Remember to eat liberally from food groups that are naturally free of cholesterol and saturated fat, including fish, fruit, vegetables, beans, grains (barley, rice, couscous, bul-gur wheat), and pasta (without cream sauces).    IDENTIFYING FOODS THAT LOWER CHOLESTEROL . . .  Soluble fiber found in fruits such as apples; vegetables such as broccoli, potatoes, and carrots; legumes such as beans, peas, and lentils; and grains such as barley may lower your cholesterol. Foods fortified with plant sterols, or phytosterol, may also lower cholesterol. You should eat at least 2g per day of these foods for a cholesterol lowering effect.    How can you identify low-cholesterol, low-fat foods at the supermarket? The key is to read package labels. Select cheeses that have only 2-3 g saturated fat per ounce. Use a heart healthy tub margarine free of partially hydrogenated oil such as Promise or Smart Balance. When buying baked goods (cookies, crackers), avoid partially hydrogenated oils. Breads and muffins should be made from whole grains (whole wheat or whole oat flour, instead of just "flour" or "enriched flour"). Buy non-creamy canned soups with reduced salt and no added fats.    FOOD PREPARATION TECHNIQUES . . .  Never deep fry. If you must fry, either stir fry, which uses very little fat, or use the non-stick cooking sprays like Pam. When possible, broil, bake, or roast meats, and steam vegetables. Instead of dressing vegetables with butter or margarine, use lemon and herbs, applesauce and cinnamon (for squash and sweet potatoes), non-fat yogurt, salsa, and low-fat dressings for salads.     See the following table for food, cholesterol, and fat information.  FOODS HIGH IN CHOLESTEROL AND/OR SATURATED FAT  LOW CHOLESTEROL/LOW-FAT SUBSTITUTES      Cholesterol  (milligrams)  Saturated Fat  (grams)    Cholesterol  (milligrams)  Saturated Fat  (grams)    Steak, marbled (3 oz)  90  11  Steak, lean (3 oz)  50  4    Hamburger (3 oz)  80  7  Hamburger, lean (3 oz)  50  5    Ham (3 oz)  53  6  Ham, lean cut (3 oz)  35  2.4    Chicken, with skin   (3 oz)      Chicken, skin removed, 3 oz        Dark meat  90  4  Dark meat  80  2    Light meat  80  2.5  Light meat  70  1    Egg (1 Large)  200-300  1.7  Egg substitutes ("Eggbeaters")  0  0    Whole Milk (1 cup)  33  5  Low-fat milk (2%)   (1 cup)  18  3          Low-fat milk (1%)   (1 cup)  10  1.5          Skim milk (1 cup)  trace  0.3    Hard Cheese (1 oz)  30  6  Skim milk cheese   (  1 oz)  16  2-3    Cottage Cheese    (1 cup) (4% fat)  35  6.5  Low-fat cottage cheese (1 cup) (1% fat)  10  1.5    Ice cream (1 cup)  60  9  Sherbet (1 cup)  14  2.5          Non-fat frozen yogurt (1 cup)  0  0.3          Frozen fruit bars  0  trace    Whipped cream   (1 tbsp)  20  3.5  Non-dairy whipped toppings (1 tbsp)  trace  1    Mayonnaise   (1 tbsp)  5  2  Low-fat mayonnaise (1 tbsp)  5  1    Butter (1 tbsp)  30  7  Extra light margarines ( tbsp)  0  1    Oils (1 tbsp)      Oils (1 tbsp)        Saturated      Monounsaturated        Coconut oil  0  11.8  Olive  0  1.8          Polyunsaturated              Corn  0  1.7          Safflower  0  1.2          Sunflower  0  1.4          Soybean  0  2.4    Document Released: 12/16/2005 Document Re-Released: 10/13/2007  [employer] Patient Information 2011 [employer].

## 2011-05-14 ENCOUNTER — Encounter: Payer: Self-pay | Admitting: Family Medicine

## 2011-05-17 NOTE — Op Note (Signed)
[name], Joann Townsend                        ACCOUNT NO.:  [id number]   MEDICAL RECORD NO.:  [id number]                   PATIENT TYPE:  INP   LOCATION:  9325                                 FACILITY:  [facility]   PHYSICIAN:  Naima A. Dillard, M.D.              DATE OF BIRTH:  1956/09/25   DATE OF PROCEDURE:  06/24/2003  DATE OF DISCHARGE:                                 OPERATIVE REPORT   PREOPERATIVE DIAGNOSIS:  Symptomatic fibroids.   POSTOPERATIVE DIAGNOSES:  1. Symptomatic fibroids.  2. Pelvic adhesions.   PROCEDURE:  Total abdominal hysterectomy, bilateral salpingo-oophorectomy.   ANESTHESIA:  General.   SURGEON:  Naima A. Normand Sloop, M.D.   ASSISTANT:  Osborn Coho, M.D.   ESTIMATED BLOOD LOSS:  1200 mL.   INTRAVENOUS FLUIDS:  4300 mL.   URINE OUTPUT:  450 mL clear urine at the end of procedure.   FINDINGS:  A 20-week size, 900 g uterus that had multiple fibroids, normal-  appearing tubes and ovaries bilaterally.  Dense bladder-cervical adhesions  and posterior sigmoid-cervical adhesions.  There were no complications.  The  patient went to the recovery room in stable condition.   PROCEDURE IN DETAIL:  The patient was taken to the operating room, where she  was given general anesthesia, prepped and draped in a normal sterile  fashion.  A Foley catheter was placed.  A vertical incision was then made  along her previous incisions with the scalpel and carried down to the fascia  using Bovie cautery.  The fascia was then incised in the midline and using  Bovie cautery extended bilaterally.  The peritoneum was identified, tented  up, and entered sharply with Metzenbaum scissors and extended superiorly and  inferiorly along the same line as the fascia with paying attention to bowel  and bladder.  The uterus was then exteriorized out of the abdominal cavity.  We were unable to place a retractor at this time as we were unable to fully  see the retractor in place.  Attention  was then turned to the patient's  round ligaments, which were doubly clamped, suture ligated, and cut,  hemostasis was assured.  The bladder flap and the bladder adhesions were  dissected bluntly using peanuts and sponge on a stick and sharply using  Metzenbaum scissors and Bovie cautery.  Attention was then turned to the  patient's right utero-ovarian ligament, which was doubly clamped,  transected, and suture ligated.  This was done in order to free up the  uterus to get better view of the pelvis.  Attention was then turned to the  patient's bladder, which was again dissected further away from the uterus  and cervix.  The patient's left ureter was then identified and the patient's  left infundibulopelvic ligament was doubly clamped, cut, free-tied, and  suture ligated.  Hemostasis was assured.  The bladder was further dissected  off of the cervix both sharply and bluntly using  the same technique, and the  uterine arteries were skeletonized bilaterally, clamped with Heaney clamps,  cut, and suture ligated.  Hemostasis was assured.  A fundectomy was then  done after we made sure that the bladder was far enough away from the cervix  in order for the fundectomy.  The O'Connor-O'Sullivan was then placed, the  bowel was packed away, the bladder was packed away using retractors.  The  cardinal and uterosacral ligament were clamped, transected, and suture  ligated after the bladder was further taken down sharply with Metzenbaum  scissors.  Hemostasis was assured at the pedicles before the uterosacral  ligaments were clamped.  There were some sigmoid-posterior cervical  adhesions, which were taken down sharply.  Hemostasis was assured.  The  serosa of the sigmoid was found to be intact.  The cervix was then removed  with scissors.  The angles were suture ligated and attached to the  uterosacral ligaments.  The vaginal cuff was repaired or closed with 0  Vicryl in interrupted figure-of-eight  stitches.  There was some bleeding  along the posterior aspect of the bladder from where the adhesions were  taken down.  These were made hemostatic with Bovie cautery.  There was one  area on the bladder that could not be made hemostatic with Bovie cautery and  was made hemostatic with two figure-of-eight 4-0 Vicryl stitches.  The  pelvis was irrigated copiously with saline.  The right ureter was identified  and noted to be peristaltic.  The right infundibulopelvic ligament was  doubly clamped, cut, free-tied, and then suture ligated.  Hemostasis was  assured.  Copious saline was done to the pelvis and abdomen.  Hemostasis was  found to be assured where figure-of-eight stitches were placed.  There was  still a scant amount of oozing from behind the bladder.  Most areas were  made hemostatic using Bovie cautery.  We did also put a little piece of  Gelfoam in that area to help make things hemostatic.  Again irrigation was  done and hemostasis was assured.  All instruments were removed from the  abdomen and pelvis.  Sponge, lap, and instruments were correct x2.  The  fascia was closed with a 0 PDS in a running fashion, meeting in the midline,  closed from superior to inferior.  The subcutaneous tissue was irrigated and  noted to be hemostatic.  The skin was closed with staples.  Sponge, lap, and  needle counts were correct x2.                                               Naima A. Normand Sloop, M.D.    NAD/MEDQ  D:  06/24/2003  T:  06/26/2003  Job:  [id number]

## 2011-05-17 NOTE — Discharge Summary (Signed)
   NAMEKAYTE, BORCHARD                        ACCOUNT NO.:  [id number]   MEDICAL RECORD NO.:  [id number]                   PATIENT TYPE:  INP   LOCATION:  9325                                 FACILITY:  [facility]   PHYSICIAN:  Naima A. Dillard, M.D.              DATE OF BIRTH:  May 11, 1956   DATE OF ADMISSION:  06/24/2003  DATE OF DISCHARGE:  06/26/2003                                 DISCHARGE SUMMARY   DISCHARGE MEDICATIONS:  Colace, Motrin, Vicodin, Dilaudid, and iron and  Phenergan.   DISCHARGE COURSE:  The patient underwent a total abdominal hysterectomy and  bilateral salpingo-oophorectomy on June 24, 2003 and postoperatively she did  well.  She remained afebrile with stable vital signs.  Heart was regular and  lungs were clear.  Abdomen was soft and nontender with good bowel sounds.  A  preop hemoglobin was 12, postop hemoglobin final was 6.9.  The patient did  have 1200 mL blood loss.  Postop BUN and creatinine was 9 and 0.7.  She had  adequate urine output, voiding spontaneously.  The patient will go home on  pelvic rest.  She will return on postop day #10 for a staple removal.  She  has asymptomatic anemic.  We will continue her on iron and Colace and she  will be able to go home after she has flatus or a BM.  She is tolerating a  regular diet this morning.  She also was given discharge medications per  [facility].                                               Naima A. Normand Sloop, M.D.    NAD/MEDQ  D:  06/26/2003  T:  06/26/2003  Job:  [id number]

## 2011-05-17 NOTE — H&P (Signed)
Joann Townsend, Joann Townsend                        ACCOUNT NO.:  [id number]   MEDICAL RECORD NO.:  [id number]                   PATIENT TYPE:  INP   LOCATION:  NA                                   [facility]   PHYSICIAN:  Naima A. Dillard, M.D.              DATE OF BIRTH:  07-Nov-1956   DATE OF ADMISSION:  DATE OF DISCHARGE:                                HISTORY & PHYSICAL   HISTORY OF PRESENT ILLNESS:  The patient is an African-American female, who  presented to the office on May 05, 2003, complaining of dysmenorrhea and  rectal pressure that has been going on for the last year.  The patient  states that her menses are every month for 10-14 days.  She denies any  change in bowel or bladder habits.  Also she has abdominal pressure.  She  denies any chest pain or shortness of breath.  The patient has had an  ultrasound which showed an enlarged uterus measuring 19 weeks' size with  diffuse multiple fibroids, normal ovaries bilaterally, and an endometrial  stripe 8 mm in size.  The patient has come to have _________ treatment for  her fibroids.   PAST MEDICAL HISTORY:  1. Significant for hypercholesterolemia.  2. Hypertension.   PAST OB HISTORY:  Significant for c-sections x3.  The first c-section was  done for cephalopelvic disproportion.  The second and third were elective.   PAST GYN HISTORY:  Menarche at age 55, occurring every month, lasting for 10-  14 days.  She changes a pad about every two hours.  She denies any history  of STDs or abnormal Pap smear.  She is monogamous with her husband.   PAST SURGICAL HISTORY:  1. Significant for c-section x3  2. Repair of anal fissure.   FAMILY HISTORY:  Significant for mother with hypertension and diabetes.   SOCIAL HISTORY:  Negative x3.  No tobacco, alcohol, or illicit drug use.   MEDICATIONS:  Hydrochlorothiazide.   REVIEW OF SYSTEMS:  The patient wears reading glasses and also has a repair  of anal fissure and genitourinary  is as above.  All other review of systems  are within normal limits.   PHYSICAL EXAMINATION:  VITAL SIGNS:  The patient's blood pressure is 120/80.  Her weight is 148 pounds.  HEENT:  Her pupils are equal.  Hearing is normal.  Throat is clear.  She has  some mild erythema on the conjunctivae.  NECK:  Thyroid was not enlarged.  HEART:  Regular rate and rhythm.  CHEST:  Clear to auscultation bilaterally.  BREASTS:  Have no masses, skin discharge, or nipple retraction bilaterally.  BACK:  Has no CVA tenderness.  ABDOMEN:  Has no tenderness, masses, or organomegaly.  She has a well-healed  vertical incision from her previous C-sections.  Uterus is about 19 weeks'  size, irregular, and mobile.  EXTREMITIES:  No cyanosis, clubbing, or edema.  __________`within  normal  limits.  PELVIC:  Mobile vaginal exam was within normal limits.  Cervix was nontender  without any lesions.  It, however, is stenotic.  The uterus is about 19  weeks' size and irregular, immobile.  Adnexa is nontender.  Rectovaginal  exam was declined by the patient.  The patient recently had a colonoscopy  which showed some mild colon polyps and internal hemorrhoids.  The polyps  were benign.  Her Pap smear in May 2004 was within normal limits.   LABORATORY DATA:  Hemoglobin 13, creatinine 0.8.  Pap smear May 2004 was  within normal limits.   ASSESSMENT:  1. Menorrhagia.  Symptomatic fibroids.  2. Pharyngitis.  3. Cervical stenosis.  I attempted an endometrial biopsy, but was not able     to do it secondary to cervical stenosis.  There were no dilators present     in the [place] office.  The patient was offered to try Cytotec or     cervical block with dilation or D&C to hysteroscopy.  The patient refused     endometrial biopsy.  She understands that if any endometrial cancer is     present, she may need a second surgery for staging.  She was offered all     medical and surgical treatments for fibroids.  She was offered  birth     control pills, Depo-Provera, even Lupron, myomectomy, hysterectomy,     uterine artery embolization observation.  The patient has chosen to     proceed with abdominal hysterectomy, bilateral salpingo-oophorectomy     secondary to her age 55  She understands that the risks are, but not     limited to, bleeding infection, damage to internal organs, such as bowel,     bladder, and major blood vessel.  Because she has made the decision to     have her ovaries removed, we reviewed the Women's health initiative, and     the patient is well aware of the study and the risks that may occur     should she use hormone replacement therapy.  The patient fully     understands the risks and benefits of the procedure and  desired to     proceed.                                                    Naima A. Normand Sloop, M.D.    NAD/MEDQ  D:  06/23/2003  T:  06/23/2003  Job:  [id number]

## 2011-05-30 ENCOUNTER — Other Ambulatory Visit: Payer: Self-pay | Admitting: Family Medicine

## 2011-06-12 ENCOUNTER — Emergency Department (HOSPITAL_COMMUNITY)
Admission: EM | Admit: 2011-06-12 | Discharge: 2011-06-12 | Disposition: A | Discharge status: Home / Self Care | Payer: BC Managed Care – PPO | Attending: Emergency Medicine | Admitting: Emergency Medicine

## 2011-06-12 ENCOUNTER — Emergency Department (HOSPITAL_COMMUNITY): Payer: BC Managed Care – PPO

## 2011-06-12 DIAGNOSIS — I10 Essential (primary) hypertension: Secondary | ICD-10-CM | POA: Insufficient documentation

## 2011-06-12 DIAGNOSIS — E7800 Pure hypercholesterolemia, unspecified: Secondary | ICD-10-CM | POA: Insufficient documentation

## 2011-06-12 DIAGNOSIS — R0789 Other chest pain: Secondary | ICD-10-CM | POA: Insufficient documentation

## 2011-08-09 ENCOUNTER — Encounter: Payer: Self-pay | Admitting: Family Medicine

## 2011-08-09 ENCOUNTER — Ambulatory Visit (INDEPENDENT_AMBULATORY_CARE_PROVIDER_SITE_OTHER): Payer: BC Managed Care – PPO | Admitting: Family Medicine

## 2011-08-09 DIAGNOSIS — E785 Hyperlipidemia, unspecified: Secondary | ICD-10-CM

## 2011-08-09 DIAGNOSIS — Z780 Asymptomatic menopausal state: Secondary | ICD-10-CM

## 2011-08-09 DIAGNOSIS — Z0000 Encounter for general adult medical examination without abnormal findings: Secondary | ICD-10-CM

## 2011-08-09 LAB — CBC WITH DIFFERENTIAL/PLATELET
Basophils Relative: 0.5 % (ref 0.0–3.0)
Eosinophils Relative: 3.9 % (ref 0.0–5.0)
HCT: 39.8 % (ref 36.0–46.0)
Hemoglobin: 13 g/dL (ref 12.0–15.0)
Lymphs Abs: 1.9 10*3/uL (ref 0.7–4.0)
Monocytes Relative: 11.2 % (ref 3.0–12.0)
Neutro Abs: 1.9 10*3/uL (ref 1.4–7.7)
WBC: 4.5 10*3/uL (ref 4.5–10.5)

## 2011-08-09 LAB — POCT URINALYSIS DIPSTICK
Glucose, UA: NEGATIVE
Spec Grav, UA: 1.015
Urobilinogen, UA: 0.2

## 2011-08-09 LAB — LIPID PANEL
LDL Cholesterol: 66 mg/dL (ref 0–99)
VLDL: 14.6 mg/dL (ref 0.0–40.0)

## 2011-08-09 LAB — BASIC METABOLIC PANEL
GFR: 115.62 mL/min (ref >60.00)
Potassium: 3.8 mEq/L (ref 3.5–5.1)
Sodium: 142 mEq/L (ref 135–145)

## 2011-08-09 LAB — TSH: TSH: 1.21 u[IU]/mL (ref 0.35–5.50)

## 2011-08-09 LAB — HEPATIC FUNCTION PANEL
Bilirubin, Direct: 0.1 mg/dL (ref 0.0–0.3)
Total Bilirubin: 1 mg/dL (ref 0.3–1.2)
Total Protein: 7.7 g/dL (ref 6.0–8.3)

## 2011-08-09 MED ORDER — ESTRADIOL 0.075 MG/24HR TD PTWK: 1.0000 | MEDICATED_PATCH | TRANSDERMAL | Status: DC

## 2011-08-09 NOTE — Progress Notes (Signed)
  Subjective:     Joann Townsend is a 55 y.o. female and is here for a comprehensive physical exam. The patient reports no problems.  History   Social History  . Marital Status: Married    Spouse Name: N/A    Number of Children: N/A  . Years of Education: N/A   Occupational History  . [employer]    Social History Main Topics  . Smoking status: Never Smoker   . Smokeless tobacco: Never Used  . Alcohol Use: No  . Drug Use: No  . Sexually Active: Yes -- Female partner(s)   Other Topics Concern  . Not on file   Social History Narrative  . No narrative on file   Health Maintenance  Topic Date Due  . Pap Smear  06/09/2011  . Influenza Vaccine  09/30/2011  . Mammogram  01/27/2012  . Tetanus/tdap  06/07/2013  . Colonoscopy  06/15/2020    The following portions of the patient's history were reviewed and updated as appropriate: allergies, current medications, past family history, past medical history, past social history, past surgical history and problem list.  Review of Systems Review of Systems  Constitutional: Negative for activity change, appetite change and fatigue.  HENT: Negative for hearing loss, congestion, tinnitus and ear discharge.  dentist --due Eyes: Negative for visual disturbance (see optho q2y -- vision 20/20 +  Reading glasses)  Respiratory: Negative for cough, chest tightness and shortness of breath.   Cardiovascular: Negative for chest pain, palpitations and leg swelling.  Gastrointestinal: Negative for abdominal pain, diarrhea, constipation and abdominal distention.  Genitourinary: Negative for urgency, frequency, decreased urine volume and difficulty urinating.  Musculoskeletal: Negative for back pain, arthralgias and gait problem.  Skin: Negative for color change, pallor and rash.  Neurological: Negative for dizziness, light-headedness, numbness and headaches.  Hematological: Negative for adenopathy. Does not bruise/bleed easily.    Psychiatric/Behavioral: Negative for suicidal ideas, confusion, sleep disturbance, self-injury, dysphoric mood, decreased concentration and agitation.       Objective:    BP 120/86  Pulse 72  Ht 5' (1.524 m)  Wt 156 lb (70.761 kg)  BMI 30.47 kg/m2 General appearance: alert, cooperative, appears stated age and no distress Head: Normocephalic, without obvious abnormality, atraumatic Eyes: conjunctivae/corneas clear. PERRL, EOM's intact. Fundi benign. Ears: normal TM's and external ear canals both ears Nose: Nares normal. Septum midline. Mucosa normal. No drainage or sinus tenderness. Throat: lips, mucosa, and tongue normal; teeth and gums normal Neck: no adenopathy, no carotid bruit, no JVD, supple, symmetrical, trachea midline and thyroid not enlarged, symmetric, no tenderness/mass/nodules Back: symmetric, no curvature. ROM normal. No CVA tenderness. Lungs: clear to auscultation bilaterally Breasts: normal appearance, no masses or tenderness Heart: regular rate and rhythm, S1, S2 normal, no murmur, click, rub or gallop Abdomen: soft, non-tender; bowel sounds normal; no masses,  no organomegaly Pelvic: uterus surgically absent and exam deferred Extremities: extremities normal, atraumatic, no cyanosis or edema Pulses: 2+ and symmetric Skin: Skin color, texture, turgor normal. No rashes or lesions Lymph nodes: Cervical, supraclavicular, and axillary nodes normal. Neurologic: Alert and oriented X 3, normal strength and tone. Normal symmetric reflexes. Normal coordination and gait psych--no depression, anxiety    Assessment:    Healthy female exam.     Hyperlipidemia--con't meds,  Check labs Postmenopausal--refill hormones Plan:  Check fasting labs ghm utd   See After Visit Summary for Counseling Recommendations

## 2011-08-09 NOTE — Patient Instructions (Signed)

## 2011-08-12 ENCOUNTER — Telehealth: Payer: Self-pay | Admitting: *Deleted

## 2011-08-12 DIAGNOSIS — E785 Hyperlipidemia, unspecified: Secondary | ICD-10-CM

## 2011-08-12 NOTE — Telephone Encounter (Signed)
Pt aware of results. Pt to call back to schedule labs.

## 2011-08-12 NOTE — Telephone Encounter (Signed)
Glucose is elevated-----watch simple sugars and starches.---recheck 3 months 790.6 Bmp, hgba1c Cholesterol--- LDL goal < 100, HDL >40, TG < 150. Diet and exercise will increase HDL and decrease LDL and TG. Fish, Fish Oil, Flaxseed oil will also help increase the HDL and decrease Triglycerides. Recheck labs in 6 months. 272.4 Lipid, hep    Left message on machine to call back.

## 2011-08-13 ENCOUNTER — Encounter: Payer: Self-pay | Admitting: Family Medicine

## 2011-09-06 ENCOUNTER — Other Ambulatory Visit: Payer: BC Managed Care – PPO

## 2011-09-13 ENCOUNTER — Telehealth: Payer: Self-pay | Admitting: *Deleted

## 2011-09-13 MED ORDER — CLIMARA 0.075 MG/24HR TD PTWK: 1.0000 | MEDICATED_PATCH | TRANSDERMAL | Status: DC

## 2011-09-13 NOTE — Telephone Encounter (Signed)
Rx Faxed and VM left making the patient aware     KP

## 2011-09-13 NOTE — Telephone Encounter (Signed)
Pt states that since changing to generic estradiol she has experience a increase in flashing. Pt would like to change back to [facility] brand name.Please advise

## 2011-09-13 NOTE — Telephone Encounter (Signed)
Ok to switch back? 

## 2011-09-19 ENCOUNTER — Other Ambulatory Visit: Payer: Self-pay | Admitting: Family Medicine

## 2012-03-21 ENCOUNTER — Ambulatory Visit: Payer: BC Managed Care – PPO | Admitting: Internal Medicine

## 2012-04-03 ENCOUNTER — Other Ambulatory Visit (INDEPENDENT_AMBULATORY_CARE_PROVIDER_SITE_OTHER): Payer: BC Managed Care – PPO

## 2012-04-03 DIAGNOSIS — E785 Hyperlipidemia, unspecified: Secondary | ICD-10-CM

## 2012-04-03 DIAGNOSIS — R799 Abnormal finding of blood chemistry, unspecified: Secondary | ICD-10-CM

## 2012-04-03 LAB — LIPID PANEL
HDL: 41.4 mg/dL (ref >39.00)
LDL Cholesterol: 99 mg/dL (ref 0–99)
Total CHOL/HDL Ratio: 4
VLDL: 25.2 mg/dL (ref 0.0–40.0)

## 2012-04-03 LAB — BASIC METABOLIC PANEL
BUN: 14 mg/dL (ref 6–23)
CO2: 28 mEq/L (ref 19–32)
Chloride: 103 mEq/L (ref 96–112)
Creatinine, Ser: 0.7 mg/dL (ref 0.4–1.2)
Glucose, Bld: 106 mg/dL — ABNORMAL HIGH (ref 70–99)
Potassium: 3.5 mEq/L (ref 3.5–5.1)

## 2012-04-03 LAB — HEPATIC FUNCTION PANEL
ALT: 21 U/L (ref 0–35)
AST: 25 U/L (ref 0–37)
Alkaline Phosphatase: 58 U/L (ref 39–117)
Bilirubin, Direct: 0.1 mg/dL (ref 0.0–0.3)
Total Bilirubin: 0.8 mg/dL (ref 0.3–1.2)

## 2012-04-03 LAB — HEMOGLOBIN A1C: Hgb A1c MFr Bld: 6.4 % (ref 4.6–6.5)

## 2012-04-06 ENCOUNTER — Other Ambulatory Visit: Payer: Self-pay | Admitting: Family Medicine

## 2012-08-17 ENCOUNTER — Ambulatory Visit: Payer: Self-pay | Admitting: Obstetrics and Gynecology

## 2012-08-17 ENCOUNTER — Encounter: Payer: Self-pay | Admitting: Obstetrics and Gynecology

## 2012-09-29 ENCOUNTER — Encounter: Payer: BC Managed Care – PPO | Admitting: Family Medicine

## 2012-10-02 ENCOUNTER — Encounter: Payer: Self-pay | Admitting: Gastroenterology

## 2012-10-02 ENCOUNTER — Ambulatory Visit (INDEPENDENT_AMBULATORY_CARE_PROVIDER_SITE_OTHER): Payer: BC Managed Care – PPO | Admitting: Family Medicine

## 2012-10-02 ENCOUNTER — Other Ambulatory Visit (HOSPITAL_COMMUNITY)
Admission: RE | Admit: 2012-10-02 | Discharge: 2012-10-02 | Disposition: A | Discharge status: Home / Self Care | Payer: BC Managed Care – PPO | Source: Ambulatory Visit | Attending: Family Medicine | Admitting: Family Medicine

## 2012-10-02 ENCOUNTER — Encounter: Payer: Self-pay | Admitting: Family Medicine

## 2012-10-02 VITALS — BP 122/90 | HR 67 | Temp 98.1°F | Wt 159.4 lb

## 2012-10-02 DIAGNOSIS — R030 Elevated blood-pressure reading, without diagnosis of hypertension: Secondary | ICD-10-CM

## 2012-10-02 DIAGNOSIS — Z0000 Encounter for general adult medical examination without abnormal findings: Secondary | ICD-10-CM

## 2012-10-02 DIAGNOSIS — Z124 Encounter for screening for malignant neoplasm of cervix: Secondary | ICD-10-CM

## 2012-10-02 DIAGNOSIS — E785 Hyperlipidemia, unspecified: Secondary | ICD-10-CM

## 2012-10-02 DIAGNOSIS — R195 Other fecal abnormalities: Secondary | ICD-10-CM

## 2012-10-02 DIAGNOSIS — K921 Melena: Secondary | ICD-10-CM

## 2012-10-02 DIAGNOSIS — Z01419 Encounter for gynecological examination (general) (routine) without abnormal findings: Secondary | ICD-10-CM | POA: Insufficient documentation

## 2012-10-02 LAB — BASIC METABOLIC PANEL
BUN: 13 mg/dL (ref 6–23)
CO2: 29 mEq/L (ref 19–32)
Calcium: 8.6 mg/dL (ref 8.4–10.5)
Creatinine, Ser: 0.7 mg/dL (ref 0.4–1.2)
GFR: 109.54 mL/min (ref >60.00)
Glucose, Bld: 95 mg/dL (ref 70–99)
Sodium: 136 mEq/L (ref 135–145)

## 2012-10-02 LAB — CBC WITH DIFFERENTIAL/PLATELET
Basophils Absolute: 0 10*3/uL (ref 0.0–0.1)
Eosinophils Absolute: 0.1 10*3/uL (ref 0.0–0.7)
Lymphocytes Relative: 45 % (ref 12.0–46.0)
MCHC: 32.2 g/dL (ref 30.0–36.0)
Monocytes Relative: 13.8 % — ABNORMAL HIGH (ref 3.0–12.0)
Neutrophils Relative %: 38.3 % — ABNORMAL LOW (ref 43.0–77.0)
Platelets: 171 10*3/uL (ref 150.0–400.0)
RDW: 13.6 % (ref 11.5–14.6)

## 2012-10-02 LAB — POCT URINALYSIS DIPSTICK
Ketones, UA: NEGATIVE
Leukocytes, UA: NEGATIVE
Protein, UA: NEGATIVE
Spec Grav, UA: 1.02
Urobilinogen, UA: 0.2
pH, UA: 6

## 2012-10-02 LAB — LIPID PANEL
HDL: 39.2 mg/dL (ref >39.00)
LDL Cholesterol: 82 mg/dL (ref 0–99)
VLDL: 16.6 mg/dL (ref 0.0–40.0)

## 2012-10-02 LAB — TSH: TSH: 1.77 u[IU]/mL (ref 0.35–5.50)

## 2012-10-02 LAB — HEPATIC FUNCTION PANEL
AST: 21 U/L (ref 0–37)
Albumin: 3.8 g/dL (ref 3.5–5.2)
Alkaline Phosphatase: 61 U/L (ref 39–117)
Total Bilirubin: 0.8 mg/dL (ref 0.3–1.2)

## 2012-10-02 NOTE — Progress Notes (Signed)
Subjective:     Joann Townsend is a 56 y.o. female and is here for a comprehensive physical exam. The patient reports no problems.  History   Social History  . Marital Status: Married    Spouse Name: N/A    Number of Children: N/A  . Years of Education: N/A   Occupational History  . [employer]    Social History Main Topics  . Smoking status: Never Smoker   . Smokeless tobacco: Never Used  . Alcohol Use: No  . Drug Use: No  . Sexually Active: Yes -- Female partner(s)   Other Topics Concern  . Not on file   Social History Narrative   Exercise-- occasional   Health Maintenance  Topic Date Due  . Mammogram  01/27/2012  . Influenza Vaccine  08/30/2012  . Tetanus/tdap  06/07/2013  . Pap Smear  10/03/2015  . Colonoscopy  06/15/2020    The following portions of the patient's history were reviewed and updated as appropriate:  She  has a past medical history of Anal fissure; Hyperlipidemia; and Colon polyps. She  does not have any pertinent problems on file. She  has past surgical history that includes Abdominal hysterectomy; Colonoscopy w/ polypectomy; and Anal fissure repair. Her family history includes Alcohol abuse in her father; Diabetes in her brother and mother; Heart disease in her father; and Hypertension in her mother. She  reports that she has never smoked. She has never used smokeless tobacco. She reports that she does not drink alcohol or use illicit drugs. She has a current medication list which includes the following prescription(s): atorvastatin and climara. Current Outpatient Prescriptions on File Prior to Visit  Medication Sig Dispense Refill  . atorvastatin (LIPITOR) 10 MG tablet take 1 tablet by mouth at bedtime  30 tablet  5  . CLIMARA 0.075 MG/24HR Place 1 patch (0.075 mg total) onto the skin once a week.  4 patch  12   She  has no known allergies..  Review of Systems Review of Systems  Constitutional: Negative for activity change, appetite  change and fatigue.  HENT: Negative for hearing loss, congestion, tinnitus and ear discharge.  dentist--due Eyes: Negative for visual disturbance (see optho q3y -- vision corrected to 20/20 with glasses).  Respiratory: Negative for cough, chest tightness and shortness of breath.   Cardiovascular: Negative for chest pain, palpitations and leg swelling.  Gastrointestinal: Negative for abdominal pain, diarrhea, constipation and abdominal distention.  Genitourinary: Negative for urgency, frequency, decreased urine volume and difficulty urinating.  Musculoskeletal: Negative for back pain, arthralgias and gait problem.  Skin: Negative for color change, pallor and rash.  Neurological: Negative for dizziness, light-headedness, numbness and headaches.  Hematological: Negative for adenopathy. Does not bruise/bleed easily.  Psychiatric/Behavioral: Negative for suicidal ideas, confusion, sleep disturbance, self-injury, dysphoric mood, decreased concentration and agitation.       Objective:    BP 122/90  Pulse 67  Temp 98.1 F (36.7 C) (Oral)  Wt 159 lb 6.4 oz (72.303 kg)  SpO2 98% General appearance: alert, cooperative, appears stated age and no distress Head: Normocephalic, without obvious abnormality, atraumatic Eyes: conjunctivae/corneas clear. PERRL, EOM's intact. Fundi benign. Ears: normal TM's and external ear canals both ears Nose: Nares normal. Septum midline. Mucosa normal. No drainage or sinus tenderness. Throat: lips, mucosa, and tongue normal; teeth and gums normal Neck: no adenopathy, no carotid bruit, no JVD, supple, symmetrical, trachea midline and thyroid not enlarged, symmetric, no tenderness/mass/nodules Back: symmetric, no curvature. ROM normal. No CVA  tenderness. Lungs: clear to auscultation bilaterally Breasts: normal appearance, no masses or tenderness Heart: regular rate and rhythm, S1, S2 normal, no murmur, click, rub or gallop Abdomen: soft, non-tender; bowel sounds  normal; no masses,  no organomegaly Pelvic: external genitalia normal, no adnexal masses or tenderness, rectovaginal septum normal, uterus normal size, shape, and consistency and uterus surgically absent Extremities: extremities normal, atraumatic, no cyanosis or edema Pulses: 2+ and symmetric Skin: Skin color, texture, turgor normal. No rashes or lesions Lymph nodes: Cervical, supraclavicular, and axillary nodes normal. Neurologic: Alert and oriented X 3, normal strength and tone. Normal symmetric reflexes. Normal coordination and gait rectal--heme + brown stool    Assessment:    Healthy female exam.      Plan:  ghm -- pt refuses bmd,  will scheule mammogram for dec/jan, refused flu shot  Check labs See After Visit Summary for Counseling Recommendations

## 2012-10-02 NOTE — Assessment & Plan Note (Signed)
Refer to GI 

## 2012-10-02 NOTE — Assessment & Plan Note (Signed)
Check labs con't meds 

## 2012-10-02 NOTE — Patient Instructions (Addendum)
Preventive Care for Adults, Female A healthy lifestyle and preventive care can promote health and wellness. Preventive health guidelines for women include the following key practices.  A routine yearly physical is a good way to check with your caregiver about your health and preventive screening. It is a chance to share any concerns and updates on your health, and to receive a thorough exam.  Visit your dentist for a routine exam and preventive care every 6 months. Brush your teeth twice a day and floss once a day. Good oral hygiene prevents tooth decay and gum disease.  The frequency of eye exams is based on your age, health, family medical history, use of contact lenses, and other factors. Follow your caregiver's recommendations for frequency of eye exams.  Eat a healthy diet. Foods like vegetables, fruits, whole grains, low-fat dairy products, and lean protein foods contain the nutrients you need without too many calories. Decrease your intake of foods high in solid fats, added sugars, and salt. Eat the right amount of calories for you.Get information about a proper diet from your caregiver, if necessary.  Regular physical exercise is one of the most important things you can do for your health. Most adults should get at least 150 minutes of moderate-intensity exercise (any activity that increases your heart rate and causes you to sweat) each week. In addition, most adults need muscle-strengthening exercises on 2 or more days a week.  Maintain a healthy weight. The body mass index (BMI) is a screening tool to identify possible weight problems. It provides an estimate of body fat based on height and weight. Your caregiver can help determine your BMI, and can help you achieve or maintain a healthy weight.For adults 20 years and older:  A BMI below 18.5 is considered underweight.  A BMI of 18.5 to 24.9 is normal.  A BMI of 25 to 29.9 is considered overweight.  A BMI of 30 and above is  considered obese.  Maintain normal blood lipids and cholesterol levels by exercising and minimizing your intake of saturated fat. Eat a balanced diet with plenty of fruit and vegetables. Blood tests for lipids and cholesterol should begin at age 20 and be repeated every 5 years. If your lipid or cholesterol levels are high, you are over 50, or you are at high risk for heart disease, you may need your cholesterol levels checked more frequently.Ongoing high lipid and cholesterol levels should be treated with medicines if diet and exercise are not effective.  If you smoke, find out from your caregiver how to quit. If you do not use tobacco, do not start.  If you are pregnant, do not drink alcohol. If you are breastfeeding, be very cautious about drinking alcohol. If you are not pregnant and choose to drink alcohol, do not exceed 1 drink per day. One drink is considered to be 12 ounces (355 mL) of beer, 5 ounces (148 mL) of wine, or 1.5 ounces (44 mL) of liquor.  Avoid use of street drugs. Do not share needles with anyone. Ask for help if you need support or instructions about stopping the use of drugs.  High blood pressure causes heart disease and increases the risk of stroke. Your blood pressure should be checked at least every 1 to 2 years. Ongoing high blood pressure should be treated with medicines if weight loss and exercise are not effective.  If you are 55 to 56 years old, ask your caregiver if you should take aspirin to prevent strokes.  Diabetes   screening involves taking a blood sample to check your fasting blood sugar level. This should be done once every 3 years, after age 45, if you are within normal weight and without risk factors for diabetes. Testing should be considered at a younger age or be carried out more frequently if you are overweight and have at least 1 risk factor for diabetes.  Breast cancer screening is essential preventive care for women. You should practice "breast  self-awareness." This means understanding the normal appearance and feel of your breasts and may include breast self-examination. Any changes detected, no matter how small, should be reported to a caregiver. Women in their 20s and 30s should have a clinical breast exam (CBE) by a caregiver as part of a regular health exam every 1 to 3 years. After age 40, women should have a CBE every year. Starting at age 40, women should consider having a mammography (breast X-ray test) every year. Women who have a family history of breast cancer should talk to their caregiver about genetic screening. Women at a high risk of breast cancer should talk to their caregivers about having magnetic resonance imaging (MRI) and a mammography every year.  The Pap test is a screening test for cervical cancer. A Pap test can show cell changes on the cervix that might become cervical cancer if left untreated. A Pap test is a procedure in which cells are obtained and examined from the lower end of the uterus (cervix).  Women should have a Pap test starting at age 21.  Between ages 21 and 29, Pap tests should be repeated every 2 years.  Beginning at age 30, you should have a Pap test every 3 years as long as the past 3 Pap tests have been normal.  Some women have medical problems that increase the chance of getting cervical cancer. Talk to your caregiver about these problems. It is especially important to talk to your caregiver if a new problem develops soon after your last Pap test. In these cases, your caregiver may recommend more frequent screening and Pap tests.  The above recommendations are the same for women who have or have not gotten the vaccine for human papillomavirus (HPV).  If you had a hysterectomy for a problem that was not cancer or a condition that could lead to cancer, then you no longer need Pap tests. Even if you no longer need a Pap test, a regular exam is a good idea to make sure no other problems are  starting.  If you are between ages 65 and 70, and you have had normal Pap tests going back 10 years, you no longer need Pap tests. Even if you no longer need a Pap test, a regular exam is a good idea to make sure no other problems are starting.  If you have had past treatment for cervical cancer or a condition that could lead to cancer, you need Pap tests and screening for cancer for at least 20 years after your treatment.  If Pap tests have been discontinued, risk factors (such as a new sexual partner) need to be reassessed to determine if screening should be resumed.  The HPV test is an additional test that may be used for cervical cancer screening. The HPV test looks for the virus that can cause the cell changes on the cervix. The cells collected during the Pap test can be tested for HPV. The HPV test could be used to screen women aged 30 years and older, and should   be used in women of any age who have unclear Pap test results. After the age of 30, women should have HPV testing at the same frequency as a Pap test.  Colorectal cancer can be detected and often prevented. Most routine colorectal cancer screening begins at the age of 50 and continues through age 75. However, your caregiver may recommend screening at an earlier age if you have risk factors for colon cancer. On a yearly basis, your caregiver may provide home test kits to check for hidden blood in the stool. Use of a small camera at the end of a tube, to directly examine the colon (sigmoidoscopy or colonoscopy), can detect the earliest forms of colorectal cancer. Talk to your caregiver about this at age 50, when routine screening begins. Direct examination of the colon should be repeated every 5 to 10 years through age 75, unless early forms of pre-cancerous polyps or small growths are found.  Hepatitis C blood testing is recommended for all people born from 1945 through 1965 and any individual with known risks for hepatitis C.  Practice  safe sex. Use condoms and avoid high-risk sexual practices to reduce the spread of sexually transmitted infections (STIs). STIs include gonorrhea, chlamydia, syphilis, trichomonas, herpes, HPV, and human immunodeficiency virus (HIV). Herpes, HIV, and HPV are viral illnesses that have no cure. They can result in disability, cancer, and death. Sexually active women aged 25 and younger should be checked for chlamydia. Older women with new or multiple partners should also be tested for chlamydia. Testing for other STIs is recommended if you are sexually active and at increased risk.  Osteoporosis is a disease in which the bones lose minerals and strength with aging. This can result in serious bone fractures. The risk of osteoporosis can be identified using a bone density scan. Women ages 65 and over and women at risk for fractures or osteoporosis should discuss screening with their caregivers. Ask your caregiver whether you should take a calcium supplement or vitamin D to reduce the rate of osteoporosis.  Menopause can be associated with physical symptoms and risks. Hormone replacement therapy is available to decrease symptoms and risks. You should talk to your caregiver about whether hormone replacement therapy is right for you.  Use sunscreen with sun protection factor (SPF) of 30 or more. Apply sunscreen liberally and repeatedly throughout the day. You should seek shade when your shadow is shorter than you. Protect yourself by wearing long sleeves, pants, a wide-brimmed hat, and sunglasses year round, whenever you are outdoors.  Once a month, do a whole body skin exam, using a mirror to look at the skin on your back. Notify your caregiver of new moles, moles that have irregular borders, moles that are larger than a pencil eraser, or moles that have changed in shape or color.  Stay current with required immunizations.  Influenza. You need a dose every fall (or winter). The composition of the flu vaccine  changes each year, so being vaccinated once is not enough.  Pneumococcal polysaccharide. You need 1 to 2 doses if you smoke cigarettes or if you have certain chronic medical conditions. You need 1 dose at age 65 (or older) if you have never been vaccinated.  Tetanus, diphtheria, pertussis (Tdap, Td). Get 1 dose of Tdap vaccine if you are younger than age 65, are over 65 and have contact with an infant, are a healthcare worker, are pregnant, or simply want to be protected from whooping cough. After that, you need a Td   booster dose every 10 years. Consult your caregiver if you have not had at least 3 tetanus and diphtheria-containing shots sometime in your life or have a deep or dirty wound.  HPV. You need this vaccine if you are a woman age 26 or younger. The vaccine is given in 3 doses over 6 months.  Measles, mumps, rubella (MMR). You need at least 1 dose of MMR if you were born in 1957 or later. You may also need a second dose.  Meningococcal. If you are age 19 to 21 and a first-year college student living in a residence hall, or have one of several medical conditions, you need to get vaccinated against meningococcal disease. You may also need additional booster doses.  Zoster (shingles). If you are age 60 or older, you should get this vaccine.  Varicella (chickenpox). If you have never had chickenpox or you were vaccinated but received only 1 dose, talk to your caregiver to find out if you need this vaccine.  Hepatitis A. You need this vaccine if you have a specific risk factor for hepatitis A virus infection or you simply wish to be protected from this disease. The vaccine is usually given as 2 doses, 6 to 18 months apart.  Hepatitis B. You need this vaccine if you have a specific risk factor for hepatitis B virus infection or you simply wish to be protected from this disease. The vaccine is given in 3 doses, usually over 6 months. Preventive Services / Frequency Ages 19 to 39  Blood  pressure check.** / Every 1 to 2 years.  Lipid and cholesterol check.** / Every 5 years beginning at age 20.  Clinical breast exam.** / Every 3 years for women in their 20s and 30s.  Pap test.** / Every 2 years from ages 21 through 29. Every 3 years starting at age 30 through age 65 or 70 with a history of 3 consecutive normal Pap tests.  HPV screening.** / Every 3 years from ages 30 through ages 65 to 70 with a history of 3 consecutive normal Pap tests.  Hepatitis C blood test.** / For any individual with known risks for hepatitis C.  Skin self-exam. / Monthly.  Influenza immunization.** / Every year.  Pneumococcal polysaccharide immunization.** / 1 to 2 doses if you smoke cigarettes or if you have certain chronic medical conditions.  Tetanus, diphtheria, pertussis (Tdap, Td) immunization. / A one-time dose of Tdap vaccine. After that, you need a Td booster dose every 10 years.  HPV immunization. / 3 doses over 6 months, if you are 26 and younger.  Measles, mumps, rubella (MMR) immunization. / You need at least 1 dose of MMR if you were born in 1957 or later. You may also need a second dose.  Meningococcal immunization. / 1 dose if you are age 19 to 21 and a first-year college student living in a residence hall, or have one of several medical conditions, you need to get vaccinated against meningococcal disease. You may also need additional booster doses.  Varicella immunization.** / Consult your caregiver.  Hepatitis A immunization.** / Consult your caregiver. 2 doses, 6 to 18 months apart.  Hepatitis B immunization.** / Consult your caregiver. 3 doses usually over 6 months. Ages 40 to 64  Blood pressure check.** / Every 1 to 2 years.  Lipid and cholesterol check.** / Every 5 years beginning at age 20.  Clinical breast exam.** / Every year after age 40.  Mammogram.** / Every year beginning at age 40   and continuing for as long as you are in good health. Consult with your  caregiver.  Pap test.** / Every 3 years starting at age 30 through age 65 or 70 with a history of 3 consecutive normal Pap tests.  HPV screening.** / Every 3 years from ages 30 through ages 65 to 70 with a history of 3 consecutive normal Pap tests.  Fecal occult blood test (FOBT) of stool. / Every year beginning at age 50 and continuing until age 75. You may not need to do this test if you get a colonoscopy every 10 years.  Flexible sigmoidoscopy or colonoscopy.** / Every 5 years for a flexible sigmoidoscopy or every 10 years for a colonoscopy beginning at age 50 and continuing until age 75.  Hepatitis C blood test.** / For all people born from 1945 through 1965 and any individual with known risks for hepatitis C.  Skin self-exam. / Monthly.  Influenza immunization.** / Every year.  Pneumococcal polysaccharide immunization.** / 1 to 2 doses if you smoke cigarettes or if you have certain chronic medical conditions.  Tetanus, diphtheria, pertussis (Tdap, Td) immunization.** / A one-time dose of Tdap vaccine. After that, you need a Td booster dose every 10 years.  Measles, mumps, rubella (MMR) immunization. / You need at least 1 dose of MMR if you were born in 1957 or later. You may also need a second dose.  Varicella immunization.** / Consult your caregiver.  Meningococcal immunization.** / Consult your caregiver.  Hepatitis A immunization.** / Consult your caregiver. 2 doses, 6 to 18 months apart.  Hepatitis B immunization.** / Consult your caregiver. 3 doses, usually over 6 months. Ages 65 and over  Blood pressure check.** / Every 1 to 2 years.  Lipid and cholesterol check.** / Every 5 years beginning at age 20.  Clinical breast exam.** / Every year after age 40.  Mammogram.** / Every year beginning at age 40 and continuing for as long as you are in good health. Consult with your caregiver.  Pap test.** / Every 3 years starting at age 30 through age 65 or 70 with a 3  consecutive normal Pap tests. Testing can be stopped between 65 and 70 with 3 consecutive normal Pap tests and no abnormal Pap or HPV tests in the past 10 years.  HPV screening.** / Every 3 years from ages 30 through ages 65 or 70 with a history of 3 consecutive normal Pap tests. Testing can be stopped between 65 and 70 with 3 consecutive normal Pap tests and no abnormal Pap or HPV tests in the past 10 years.  Fecal occult blood test (FOBT) of stool. / Every year beginning at age 50 and continuing until age 75. You may not need to do this test if you get a colonoscopy every 10 years.  Flexible sigmoidoscopy or colonoscopy.** / Every 5 years for a flexible sigmoidoscopy or every 10 years for a colonoscopy beginning at age 50 and continuing until age 75.  Hepatitis C blood test.** / For all people born from 1945 through 1965 and any individual with known risks for hepatitis C.  Osteoporosis screening.** / A one-time screening for women ages 65 and over and women at risk for fractures or osteoporosis.  Skin self-exam. / Monthly.  Influenza immunization.** / Every year.  Pneumococcal polysaccharide immunization.** / 1 dose at age 65 (or older) if you have never been vaccinated.  Tetanus, diphtheria, pertussis (Tdap, Td) immunization. / A one-time dose of Tdap vaccine if you are over   65 and have contact with an infant, are a healthcare worker, or simply want to be protected from whooping cough. After that, you need a Td booster dose every 10 years.  Varicella immunization.** / Consult your caregiver.  Meningococcal immunization.** / Consult your caregiver.  Hepatitis A immunization.** / Consult your caregiver. 2 doses, 6 to 18 months apart.  Hepatitis B immunization.** / Check with your caregiver. 3 doses, usually over 6 months. ** Family history and personal history of risk and conditions may change your caregiver's recommendations. Document Released: 02/11/2002 Document Revised: 03/09/2012  Document Reviewed: 05/13/2011 [employer] Patient Information 2013 [employer].  

## 2012-10-02 NOTE — Assessment & Plan Note (Signed)
Slightly elevated today---watch salt  Diet and exercise

## 2012-10-20 ENCOUNTER — Other Ambulatory Visit: Payer: Self-pay | Admitting: Family Medicine

## 2012-10-20 NOTE — Telephone Encounter (Signed)
Rx re-sent   KP 

## 2012-10-30 ENCOUNTER — Other Ambulatory Visit: Payer: Self-pay

## 2012-11-04 ENCOUNTER — Ambulatory Visit: Payer: BC Managed Care – PPO | Admitting: Gastroenterology

## 2012-11-09 ENCOUNTER — Encounter: Payer: Self-pay | Admitting: Gastroenterology

## 2012-11-09 ENCOUNTER — Ambulatory Visit (INDEPENDENT_AMBULATORY_CARE_PROVIDER_SITE_OTHER): Payer: BC Managed Care – PPO | Admitting: Gastroenterology

## 2012-11-09 VITALS — BP 122/78 | HR 69 | Ht 60.0 in | Wt 159.8 lb

## 2012-11-09 DIAGNOSIS — R195 Other fecal abnormalities: Secondary | ICD-10-CM

## 2012-11-09 MED ORDER — PEG-KCL-NACL-NASULF-NA ASC-C 100 G PO SOLR: 1.0000 | Freq: Once | ORAL | Status: DC

## 2012-11-09 NOTE — Patient Instructions (Addendum)
You have been scheduled for a colonoscopy with propofol. Please follow written instructions given to you at your visit today.  Please pick up your prep kit at the pharmacy within the next 1-3 days. If you use inhalers (even only as needed) or a CPAP machine, please bring them with you on the day of your procedure.  

## 2012-11-09 NOTE — Progress Notes (Signed)
History of Present Illness: This is a 56 year old female who was recently found to have Hemoccult positive stool. She has no ongoing gastrointestinal complaints. She previously underwent colonoscopy in 2004 showing a hyperplastic colon polyp and hemorrhoids. Denies weight loss, abdominal pain, constipation, diarrhea, change in stool caliber, melena, hematochezia, nausea, vomiting, dysphagia, reflux symptoms, chest pain.  Review of Systems: Pertinent positive and negative review of systems were noted in the above HPI section. All other review of systems were otherwise negative.  Current Medications, Allergies, Past Medical History, Past Surgical History, Family History and Social History were reviewed in [employer] record.  Physical Exam: General: Well developed , well nourished, no acute distress Head: Normocephalic and atraumatic Eyes:  sclerae anicteric, EOMI Ears: Normal auditory acuity Mouth: No deformity or lesions Neck: Supple, no masses or thyromegaly Lungs: Clear throughout to auscultation Heart: Regular rate and rhythm; no murmurs, rubs or bruits Abdomen: Soft, non tender and non distended. No masses, hepatosplenomegaly or hernias noted. Normal Bowel sounds Rectal: Deferred to colonoscopy, patient relates a recent DRE by Dr. Laury Axon showing heme positive stool  Musculoskeletal: Symmetrical with no gross deformities  Skin: No lesions on visible extremities Pulses:  Normal pulses noted Extremities: No clubbing, cyanosis, edema or deformities noted Neurological: Alert oriented x 4, grossly nonfocal Cervical Nodes:  No significant cervical adenopathy Inguinal Nodes: No significant inguinal adenopathy Psychological:  Alert and cooperative. Normal mood and affect  Assessment and Recommendations:  1. Hemoccult positive stool. Rule out colorectal neoplasms, hemorrhoids and other causes. Schedule colonoscopy. The risks, benefits, and alternatives to colonoscopy with  possible biopsy and possible polypectomy were discussed with the patient and they consent to proceed.

## 2012-12-07 ENCOUNTER — Other Ambulatory Visit: Payer: Self-pay | Admitting: Family Medicine

## 2012-12-31 ENCOUNTER — Encounter: Payer: BC Managed Care – PPO | Admitting: Gastroenterology

## 2013-01-29 ENCOUNTER — Ambulatory Visit (AMBULATORY_SURGERY_CENTER): Payer: BC Managed Care – PPO | Admitting: Gastroenterology

## 2013-01-29 ENCOUNTER — Encounter: Payer: Self-pay | Admitting: Gastroenterology

## 2013-01-29 ENCOUNTER — Other Ambulatory Visit: Payer: Self-pay | Admitting: Gastroenterology

## 2013-01-29 VITALS — BP 140/97 | HR 66 | Temp 98.4°F | Resp 22 | Ht 60.0 in | Wt 159.0 lb

## 2013-01-29 DIAGNOSIS — D126 Benign neoplasm of colon, unspecified: Secondary | ICD-10-CM

## 2013-01-29 DIAGNOSIS — R195 Other fecal abnormalities: Secondary | ICD-10-CM

## 2013-01-29 MED ORDER — SODIUM CHLORIDE 0.9 % IV SOLN: 500.0000 mL | INTRAVENOUS | Status: DC

## 2013-01-29 NOTE — Op Note (Signed)
[facility] [address] [place], [address]   COLONOSCOPY PROCEDURE REPORT  PATIENT: Joann Townsend, Joann Townsend  MR#: [id number] BIRTHDATE: 16-Mar-1956 , 56  yrs. old GENDER: Female ENDOSCOPIST: Meryl Dare, MD, [facility] PROCEDURE DATE:  01/29/2013 PROCEDURE:   Colonoscopy with biopsy and snare polypectomy ASA CLASS:   Class II INDICATIONS:heme-positive stool. MEDICATIONS: MAC sedation, administered by CRNA and propofol (Diprivan) 250mg  IV DESCRIPTION OF PROCEDURE:   After the risks benefits and alternatives of the procedure were thoroughly explained, informed consent was obtained.  A digital rectal exam revealed no abnormalities of the rectum.   The LB CF-[id number]  endoscope was introduced through the anus and advanced to the cecum, which was identified by both the appendix and ileocecal valve. No adverse events experienced.   The quality of the prep was excellent, using MoviPrep  The instrument was then slowly withdrawn as the colon was fully examined.  COLON FINDINGS: A sessile polyp measuring 5 mm in size was found in the ascending colon.  A polypectomy was performed with a cold snare.  The resection was complete and the polyp tissue was completely retrieved.   Sessile polyp(s) measuring 3 mm in size were found in the transverse colon.  A polypectomy was performed with cold forceps.  The resection was complete and the polyp tissue was completely retrieved.   The colon was otherwise normal.  There was no diverticulosis, inflammation, polyps or cancers unless previously stated.  Retroflexed views revealed small internal hemorrhoids. The time to cecum=3 minutes 23 seconds.  Withdrawal time=9 minutes 03 seconds.  The scope was withdrawn and the procedure completed.  COMPLICATIONS: There were no complications.  ENDOSCOPIC IMPRESSION: 1.   Sessile polyp measuring 5 mm in the ascending colon; polypectomy performed with a cold snare 2.   Sessile polyp measuring 3  mm in the transverse colon; polypectomy performed with cold forceps 3.   Small internal hemorrhoids  RECOMMENDATIONS: 1.  Await pathology results 2.  Repeat colonoscopy in 5 years if polyp adenomatous; otherwise 10 years   eSigned:  Meryl Dare, MD, [facility] 01/29/2013 1:45 PM

## 2013-01-29 NOTE — Progress Notes (Signed)
No complaints noted in the recovery room. Maw   

## 2013-01-29 NOTE — Patient Instructions (Addendum)
Handouts were given to your care partner on polyps, hemorrhoids and high fiber diet.  You may resume your current medications today.  Please call if any questions or concerns.      YOU HAD AN ENDOSCOPIC PROCEDURE TODAY AT THE [facility]: Refer to the procedure report that was given to you for any specific questions about what was found during the examination.  If the procedure report does not answer your questions, please call your gastroenterologist to clarify.  If you requested that your care partner not be given the details of your procedure findings, then the procedure report has been included in a sealed envelope for you to review at your convenience later.  YOU SHOULD EXPECT: Some feelings of bloating in the abdomen. Passage of more gas than usual.  Walking can help get rid of the air that was put into your GI tract during the procedure and reduce the bloating. If you had a lower endoscopy (such as a colonoscopy or flexible sigmoidoscopy) you may notice spotting of blood in your stool or on the toilet paper. If you underwent a bowel prep for your procedure, then you may not have a normal bowel movement for a few days.  DIET: Your first meal following the procedure should be a light meal and then it is ok to progress to your normal diet.  A half-sandwich or bowl of soup is an example of a good first meal.  Heavy or fried foods are harder to digest and may make you feel nauseous or bloated.  Likewise meals heavy in dairy and vegetables can cause extra gas to form and this can also increase the bloating.  Drink plenty of fluids but you should avoid alcoholic beverages for 24 hours.  ACTIVITY: Your care partner should take you home directly after the procedure.  You should plan to take it easy, moving slowly for the rest of the day.  You can resume normal activity the day after the procedure however you should NOT DRIVE or use heavy machinery for 24 hours (because of the sedation medicines  used during the test).    SYMPTOMS TO REPORT IMMEDIATELY: A gastroenterologist can be reached at any hour.  During normal business hours, 8:30 AM to 5:00 PM Monday through Friday, call [phone].  After hours and on weekends, please call the GI answering service at [phone] who will take a message and have the physician on call contact you.   Following lower endoscopy (colonoscopy or flexible sigmoidoscopy):  Excessive amounts of blood in the stool  Significant tenderness or worsening of abdominal pains  Swelling of the abdomen that is new, acute  Fever of 100F or higher    FOLLOW UP: If any biopsies were taken you will be contacted by phone or by letter within the next 1-3 weeks.  Call your gastroenterologist if you have not heard about the biopsies in 3 weeks.  Our staff will call the home number listed on your records the next business day following your procedure to check on you and address any questions or concerns that you may have at that time regarding the information given to you following your procedure. This is a courtesy call and so if there is no answer at the home number and we have not heard from you through the emergency physician on call, we will assume that you have returned to your regular daily activities without incident.  SIGNATURES/CONFIDENTIALITY: You and/or your care partner have signed paperwork which will be   entered into your electronic medical record.  These signatures attest to the fact that that the information above on your After Visit Summary has been reviewed and is understood.  Full responsibility of the confidentiality of this discharge information lies with you and/or your care-partner. 

## 2013-01-29 NOTE — Progress Notes (Signed)
Stable to RR 

## 2013-01-29 NOTE — Progress Notes (Signed)
Patient did not experience any of the following events: a burn prior to discharge; a fall within the facility; wrong site/side/patient/procedure/implant event; or a hospital transfer or hospital admission upon discharge from the facility. (G8907) Patient did not have preoperative order for IV antibiotic SSI prophylaxis. (G8918)  

## 2013-01-29 NOTE — Progress Notes (Signed)
Called to room to assist during endoscopic procedure.  Patient ID and intended procedure confirmed with present staff. Received instructions for my participation in the procedure from the performing physician.  

## 2013-02-01 ENCOUNTER — Telehealth: Payer: Self-pay

## 2013-02-01 NOTE — Telephone Encounter (Signed)
  Follow up Call-  Call back number 01/29/2013  Post procedure Call Back phone  # [phone]  Permission to leave phone message Yes     Patient questions:  Do you have a fever, pain , or abdominal swelling? no Pain Score  0 *  Have you tolerated food without any problems? yes  Have you been able to return to your normal activities? yes  Do you have any questions about your discharge instructions: Diet   no Medications  no Follow up visit  no  Do you have questions or concerns about your Care? no  Actions: * If pain score is 4 or above: No action needed, pain <4.

## 2013-02-03 ENCOUNTER — Encounter: Payer: Self-pay | Admitting: Gastroenterology

## 2013-10-06 ENCOUNTER — Telehealth: Payer: Self-pay

## 2013-10-06 NOTE — Telephone Encounter (Signed)
Medication and allergies: done  Pharmacy updated, uses [facility] for 90 day supply Pharmacy updated, uses [facility] for [facility]  HM UTD: no Immunizations due: Declines flu vaccine. Due for t-dap  A/P: PAP:     UTD   Last:  09/2012 MMG: due Last:  12/2009 (referral?) Dexa: UTD Last:  05/2011 CCS:   UTD DM: na HTN: na Lipids: Due (advised to fast)  To Discuss with Provider:  Would like an EKG this visit; no symptoms Nails on right hand (discoloration?)

## 2013-10-08 ENCOUNTER — Ambulatory Visit (INDEPENDENT_AMBULATORY_CARE_PROVIDER_SITE_OTHER): Payer: BC Managed Care – PPO | Admitting: Family Medicine

## 2013-10-08 ENCOUNTER — Encounter: Payer: Self-pay | Admitting: Family Medicine

## 2013-10-08 VITALS — BP 142/90 | HR 74 | Temp 98.1°F | Ht 59.5 in | Wt 163.8 lb

## 2013-10-08 DIAGNOSIS — Z780 Asymptomatic menopausal state: Secondary | ICD-10-CM

## 2013-10-08 DIAGNOSIS — L609 Nail disorder, unspecified: Secondary | ICD-10-CM

## 2013-10-08 DIAGNOSIS — Z1231 Encounter for screening mammogram for malignant neoplasm of breast: Secondary | ICD-10-CM

## 2013-10-08 DIAGNOSIS — E669 Obesity, unspecified: Secondary | ICD-10-CM | POA: Insufficient documentation

## 2013-10-08 DIAGNOSIS — E785 Hyperlipidemia, unspecified: Secondary | ICD-10-CM

## 2013-10-08 DIAGNOSIS — Z23 Encounter for immunization: Secondary | ICD-10-CM

## 2013-10-08 DIAGNOSIS — Z0000 Encounter for general adult medical examination without abnormal findings: Secondary | ICD-10-CM

## 2013-10-08 LAB — CBC WITH DIFFERENTIAL/PLATELET
Basophils Absolute: 0.1 10*3/uL (ref 0.0–0.1)
Basophils Relative: 1.6 % (ref 0.0–3.0)
Eosinophils Absolute: 0.1 10*3/uL (ref 0.0–0.7)
Eosinophils Relative: 3.3 % (ref 0.0–5.0)
HCT: 39.7 % (ref 36.0–46.0)
Hemoglobin: 13.1 g/dL (ref 12.0–15.0)
Lymphocytes Relative: 40.9 % (ref 12.0–46.0)
Lymphs Abs: 1.8 10*3/uL (ref 0.7–4.0)
MCHC: 33.1 g/dL (ref 30.0–36.0)
MCV: 84.3 fl (ref 78.0–100.0)
Monocytes Absolute: 0.6 10*3/uL (ref 0.1–1.0)
Monocytes Relative: 12.6 % — ABNORMAL HIGH (ref 3.0–12.0)
Neutro Abs: 1.8 10*3/uL (ref 1.4–7.7)
Neutrophils Relative %: 41.6 % — ABNORMAL LOW (ref 43.0–77.0)
Platelets: 173 10*3/uL (ref 150.0–400.0)
RBC: 4.71 Mil/uL (ref 3.87–5.11)
RDW: 14.5 % (ref 11.5–14.6)
WBC: 4.4 10*3/uL — ABNORMAL LOW (ref 4.5–10.5)

## 2013-10-08 LAB — HEPATIC FUNCTION PANEL
ALT: 17 U/L (ref 0–35)
AST: 23 U/L (ref 0–37)
Albumin: 4.1 g/dL (ref 3.5–5.2)
Alkaline Phosphatase: 60 U/L (ref 39–117)
Bilirubin, Direct: 0 mg/dL (ref 0.0–0.3)
Total Bilirubin: 0.8 mg/dL (ref 0.3–1.2)
Total Protein: 7.8 g/dL (ref 6.0–8.3)

## 2013-10-08 LAB — BASIC METABOLIC PANEL
CO2: 28 mEq/L (ref 19–32)
Chloride: 102 mEq/L (ref 96–112)
Glucose, Bld: 106 mg/dL — ABNORMAL HIGH (ref 70–99)
Potassium: 3.6 mEq/L (ref 3.5–5.1)
Sodium: 139 mEq/L (ref 135–145)

## 2013-10-08 LAB — LIPID PANEL
Cholesterol: 147 mg/dL (ref 0–200)
HDL: 40.7 mg/dL (ref >39.00)
VLDL: 16.8 mg/dL (ref 0.0–40.0)

## 2013-10-08 LAB — POCT URINALYSIS DIPSTICK
Bilirubin, UA: NEGATIVE
Blood, UA: NEGATIVE
Glucose, UA: NEGATIVE
Ketones, UA: NEGATIVE
Leukocytes, UA: NEGATIVE
Nitrite, UA: NEGATIVE
Protein, UA: NEGATIVE
Spec Grav, UA: <=1.005
Urobilinogen, UA: 0.2
pH, UA: 7

## 2013-10-08 LAB — TSH: TSH: 1.05 u[IU]/mL (ref 0.35–5.50)

## 2013-10-08 LAB — VITAMIN B12: Vitamin B-12: 364 pg/mL (ref 211–911)

## 2013-10-08 MED ORDER — CLIMARA 0.075 MG/24HR TD PTWK: MEDICATED_PATCH | TRANSDERMAL | Status: DC

## 2013-10-08 NOTE — Progress Notes (Signed)
Subjective:     Joann Townsend is a 57 y.o. female and is here for a comprehensive physical exam. The patient reports problems - nails-- R middle finger, darker and rippled.  History   Social History  . Marital Status: Married    Spouse Name: N/A    Number of Children: 3  . Years of Education: N/A   Occupational History  . [employer]    Social History Main Topics  . Smoking status: Never Smoker   . Smokeless tobacco: Never Used  . Alcohol Use: No  . Drug Use: No  . Sexual Activity: Yes    Partners: Male   Other Topics Concern  . Not on file   Social History Narrative   Exercise-- occasional   Health Maintenance  Topic Date Due  . Mammogram  01/27/2012  . Tetanus/tdap  06/07/2013  . Influenza Vaccine  07/30/2014  . Pap Smear  10/03/2015  . Colonoscopy  01/29/2018    The following portions of the patient's history were reviewed and updated as appropriate:  She  has a past medical history of Anal fissure; Hyperlipidemia; Internal hemorrhoids; and Colon polyp (2004). She  does not have any pertinent problems on file. She  has past surgical history that includes Abdominal hysterectomy; Colonoscopy w/ polypectomy; Anal fissure repair; and Cesarean section. Her family history includes Alcohol abuse in her father; Diabetes in her brother and mother; Heart disease in her father; Hypertension in her mother. She  reports that she has never smoked. She has never used smokeless tobacco. She reports that she does not drink alcohol or use illicit drugs. She has a current medication list which includes the following prescription(s): atorvastatin and climara. Current Outpatient Prescriptions on File Prior to Visit  Medication Sig Dispense Refill  . atorvastatin (LIPITOR) 10 MG tablet take 1 tablet by mouth at bedtime  30 tablet  11   No current facility-administered medications on file prior to visit.   She has No Known Allergies..  Review of Systems Review of  Systems  Constitutional: Negative for activity change, appetite change and fatigue.  HENT: Negative for hearing loss, congestion, tinnitus and ear discharge.  dentist due Eyes: Negative for visual disturbance (see optho q2y -- vision corrected to 20/20 with glasses).  Respiratory: Negative for cough, chest tightness and shortness of breath.   Cardiovascular: Negative for chest pain, palpitations and leg swelling.  Gastrointestinal: Negative for abdominal pain, diarrhea, constipation and abdominal distention.  Genitourinary: Negative for urgency, frequency, decreased urine volume and difficulty urinating.  Musculoskeletal: Negative for back pain, arthralgias and gait problem.  Skin: Negative for color change, pallor and rash.  Neurological: Negative for dizziness, light-headedness, numbness and headaches.  Hematological: Negative for adenopathy. Does not bruise/bleed easily.  Psychiatric/Behavioral: Negative for suicidal ideas, confusion, sleep disturbance, self-injury, dysphoric mood, decreased concentration and agitation.       Objective:    BP 142/90  Pulse 74  Temp(Src) 98.1 F (36.7 C) (Oral)  Ht 4' 11.5" (1.511 m)  Wt 163 lb 12.8 oz (74.299 kg)  BMI 32.54 kg/m2  SpO2 98% General appearance: alert, cooperative, appears stated age and no distress Head: Normocephalic, without obvious abnormality, atraumatic Eyes: conjunctivae/corneas clear. PERRL, EOM's intact. Fundi benign. Ears: normal TM's and external ear canals both ears Nose: Nares normal. Septum midline. Mucosa normal. No drainage or sinus tenderness. Throat: lips, mucosa, and tongue normal; teeth and gums normal Neck: no adenopathy, no carotid bruit, no JVD, supple, symmetrical, trachea midline and thyroid not  enlarged, symmetric, no tenderness/mass/nodules Back: symmetric, no curvature. ROM normal. No CVA tenderness. Lungs: clear to auscultation bilaterally Breasts: normal appearance, no masses or tenderness Heart:  regular rate and rhythm, S1, S2 normal, no murmur, click, rub or gallop Abdomen: soft, non-tender; bowel sounds normal; no masses,  no organomegaly Pelvic: not indicated; post-menopausal, no abnormal Pap smears in past Extremities: extremities normal, atraumatic, no cyanosis or edema Pulses: 2+ and symmetric Skin: Skin color, texture, turgor normal. No rashes or lesions Lymph nodes: Cervical, supraclavicular, and axillary nodes normal. Neurologic: Alert and oriented X 3, normal strength and tone. Normal symmetric reflexes. Normal coordination and gait Psych--no depression, no anxiety      Assessment:    Healthy female exam.      Plan:  Check labs  ghm utd Pt wants to hold off on bmd   See After Visit Summary for Counseling Recommendations

## 2013-10-08 NOTE — Patient Instructions (Signed)
Preventive Care for Adults, Female A healthy lifestyle and preventive care can promote health and wellness. Preventive health guidelines for women include the following key practices.  A routine yearly physical is a good way to check with your caregiver about your health and preventive screening. It is a chance to share any concerns and updates on your health, and to receive a thorough exam.  Visit your dentist for a routine exam and preventive care every 6 months. Brush your teeth twice a day and floss once a day. Good oral hygiene prevents tooth decay and gum disease.  The frequency of eye exams is based on your age, health, family medical history, use of contact lenses, and other factors. Follow your caregiver's recommendations for frequency of eye exams.  Eat a healthy diet. Foods like vegetables, fruits, whole grains, low-fat dairy products, and lean protein foods contain the nutrients you need without too many calories. Decrease your intake of foods high in solid fats, added sugars, and salt. Eat the right amount of calories for you.Get information about a proper diet from your caregiver, if necessary.  Regular physical exercise is one of the most important things you can do for your health. Most adults should get at least 150 minutes of moderate-intensity exercise (any activity that increases your heart rate and causes you to sweat) each week. In addition, most adults need muscle-strengthening exercises on 2 or more days a week.  Maintain a healthy weight. The body mass index (BMI) is a screening tool to identify possible weight problems. It provides an estimate of body fat based on height and weight. Your caregiver can help determine your BMI, and can help you achieve or maintain a healthy weight.For adults 20 years and older:  A BMI below 18.5 is considered underweight.  A BMI of 18.5 to 24.9 is normal.  A BMI of 25 to 29.9 is considered overweight.  A BMI of 30 and above is  considered obese.  Maintain normal blood lipids and cholesterol levels by exercising and minimizing your intake of saturated fat. Eat a balanced diet with plenty of fruit and vegetables. Blood tests for lipids and cholesterol should begin at age 20 and be repeated every 5 years. If your lipid or cholesterol levels are high, you are over 50, or you are at high risk for heart disease, you may need your cholesterol levels checked more frequently.Ongoing high lipid and cholesterol levels should be treated with medicines if diet and exercise are not effective.  If you smoke, find out from your caregiver how to quit. If you do not use tobacco, do not start.  If you are pregnant, do not drink alcohol. If you are breastfeeding, be very cautious about drinking alcohol. If you are not pregnant and choose to drink alcohol, do not exceed 1 drink per day. One drink is considered to be 12 ounces (355 mL) of beer, 5 ounces (148 mL) of wine, or 1.5 ounces (44 mL) of liquor.  Avoid use of street drugs. Do not share needles with anyone. Ask for help if you need support or instructions about stopping the use of drugs.  High blood pressure causes heart disease and increases the risk of stroke. Your blood pressure should be checked at least every 1 to 2 years. Ongoing high blood pressure should be treated with medicines if weight loss and exercise are not effective.  If you are 55 to 57 years old, ask your caregiver if you should take aspirin to prevent strokes.  Diabetes   screening involves taking a blood sample to check your fasting blood sugar level. This should be done once every 3 years, after age 45, if you are within normal weight and without risk factors for diabetes. Testing should be considered at a younger age or be carried out more frequently if you are overweight and have at least 1 risk factor for diabetes.  Breast cancer screening is essential preventive care for women. You should practice "breast  self-awareness." This means understanding the normal appearance and feel of your breasts and may include breast self-examination. Any changes detected, no matter how small, should be reported to a caregiver. Women in their 20s and 30s should have a clinical breast exam (CBE) by a caregiver as part of a regular health exam every 1 to 3 years. After age 40, women should have a CBE every year. Starting at age 40, women should consider having a mammography (breast X-ray test) every year. Women who have a family history of breast cancer should talk to their caregiver about genetic screening. Women at a high risk of breast cancer should talk to their caregivers about having magnetic resonance imaging (MRI) and a mammography every year.  The Pap test is a screening test for cervical cancer. A Pap test can show cell changes on the cervix that might become cervical cancer if left untreated. A Pap test is a procedure in which cells are obtained and examined from the lower end of the uterus (cervix).  Women should have a Pap test starting at age 21.  Between ages 21 and 29, Pap tests should be repeated every 2 years.  Beginning at age 30, you should have a Pap test every 3 years as long as the past 3 Pap tests have been normal.  Some women have medical problems that increase the chance of getting cervical cancer. Talk to your caregiver about these problems. It is especially important to talk to your caregiver if a new problem develops soon after your last Pap test. In these cases, your caregiver may recommend more frequent screening and Pap tests.  The above recommendations are the same for women who have or have not gotten the vaccine for human papillomavirus (HPV).  If you had a hysterectomy for a problem that was not cancer or a condition that could lead to cancer, then you no longer need Pap tests. Even if you no longer need a Pap test, a regular exam is a good idea to make sure no other problems are  starting.  If you are between ages 65 and 70, and you have had normal Pap tests going back 10 years, you no longer need Pap tests. Even if you no longer need a Pap test, a regular exam is a good idea to make sure no other problems are starting.  If you have had past treatment for cervical cancer or a condition that could lead to cancer, you need Pap tests and screening for cancer for at least 20 years after your treatment.  If Pap tests have been discontinued, risk factors (such as a new sexual partner) need to be reassessed to determine if screening should be resumed.  The HPV test is an additional test that may be used for cervical cancer screening. The HPV test looks for the virus that can cause the cell changes on the cervix. The cells collected during the Pap test can be tested for HPV. The HPV test could be used to screen women aged 30 years and older, and should   be used in women of any age who have unclear Pap test results. After the age of 30, women should have HPV testing at the same frequency as a Pap test.  Colorectal cancer can be detected and often prevented. Most routine colorectal cancer screening begins at the age of 50 and continues through age 75. However, your caregiver may recommend screening at an earlier age if you have risk factors for colon cancer. On a yearly basis, your caregiver may provide home test kits to check for hidden blood in the stool. Use of a small camera at the end of a tube, to directly examine the colon (sigmoidoscopy or colonoscopy), can detect the earliest forms of colorectal cancer. Talk to your caregiver about this at age 50, when routine screening begins. Direct examination of the colon should be repeated every 5 to 10 years through age 75, unless early forms of pre-cancerous polyps or small growths are found.  Hepatitis C blood testing is recommended for all people born from 1945 through 1965 and any individual with known risks for hepatitis C.  Practice  safe sex. Use condoms and avoid high-risk sexual practices to reduce the spread of sexually transmitted infections (STIs). STIs include gonorrhea, chlamydia, syphilis, trichomonas, herpes, HPV, and human immunodeficiency virus (HIV). Herpes, HIV, and HPV are viral illnesses that have no cure. They can result in disability, cancer, and death. Sexually active women aged 25 and younger should be checked for chlamydia. Older women with new or multiple partners should also be tested for chlamydia. Testing for other STIs is recommended if you are sexually active and at increased risk.  Osteoporosis is a disease in which the bones lose minerals and strength with aging. This can result in serious bone fractures. The risk of osteoporosis can be identified using a bone density scan. Women ages 65 and over and women at risk for fractures or osteoporosis should discuss screening with their caregivers. Ask your caregiver whether you should take a calcium supplement or vitamin D to reduce the rate of osteoporosis.  Menopause can be associated with physical symptoms and risks. Hormone replacement therapy is available to decrease symptoms and risks. You should talk to your caregiver about whether hormone replacement therapy is right for you.  Use sunscreen with sun protection factor (SPF) of 30 or more. Apply sunscreen liberally and repeatedly throughout the day. You should seek shade when your shadow is shorter than you. Protect yourself by wearing long sleeves, pants, a wide-brimmed hat, and sunglasses year round, whenever you are outdoors.  Once a month, do a whole body skin exam, using a mirror to look at the skin on your back. Notify your caregiver of new moles, moles that have irregular borders, moles that are larger than a pencil eraser, or moles that have changed in shape or color.  Stay current with required immunizations.  Influenza. You need a dose every fall (or winter). The composition of the flu vaccine  changes each year, so being vaccinated once is not enough.  Pneumococcal polysaccharide. You need 1 to 2 doses if you smoke cigarettes or if you have certain chronic medical conditions. You need 1 dose at age 65 (or older) if you have never been vaccinated.  Tetanus, diphtheria, pertussis (Tdap, Td). Get 1 dose of Tdap vaccine if you are younger than age 65, are over 65 and have contact with an infant, are a healthcare worker, are pregnant, or simply want to be protected from whooping cough. After that, you need a Td   booster dose every 10 years. Consult your caregiver if you have not had at least 3 tetanus and diphtheria-containing shots sometime in your life or have a deep or dirty wound.  HPV. You need this vaccine if you are a woman age 26 or younger. The vaccine is given in 3 doses over 6 months.  Measles, mumps, rubella (MMR). You need at least 1 dose of MMR if you were born in 1957 or later. You may also need a second dose.  Meningococcal. If you are age 19 to 21 and a first-year college student living in a residence hall, or have one of several medical conditions, you need to get vaccinated against meningococcal disease. You may also need additional booster doses.  Zoster (shingles). If you are age 60 or older, you should get this vaccine.  Varicella (chickenpox). If you have never had chickenpox or you were vaccinated but received only 1 dose, talk to your caregiver to find out if you need this vaccine.  Hepatitis A. You need this vaccine if you have a specific risk factor for hepatitis A virus infection or you simply wish to be protected from this disease. The vaccine is usually given as 2 doses, 6 to 18 months apart.  Hepatitis B. You need this vaccine if you have a specific risk factor for hepatitis B virus infection or you simply wish to be protected from this disease. The vaccine is given in 3 doses, usually over 6 months. Preventive Services / Frequency Ages 19 to 39  Blood  pressure check.** / Every 1 to 2 years.  Lipid and cholesterol check.** / Every 5 years beginning at age 20.  Clinical breast exam.** / Every 3 years for women in their 20s and 30s.  Pap test.** / Every 2 years from ages 21 through 29. Every 3 years starting at age 30 through age 65 or 70 with a history of 3 consecutive normal Pap tests.  HPV screening.** / Every 3 years from ages 30 through ages 65 to 70 with a history of 3 consecutive normal Pap tests.  Hepatitis C blood test.** / For any individual with known risks for hepatitis C.  Skin self-exam. / Monthly.  Influenza immunization.** / Every year.  Pneumococcal polysaccharide immunization.** / 1 to 2 doses if you smoke cigarettes or if you have certain chronic medical conditions.  Tetanus, diphtheria, pertussis (Tdap, Td) immunization. / A one-time dose of Tdap vaccine. After that, you need a Td booster dose every 10 years.  HPV immunization. / 3 doses over 6 months, if you are 26 and younger.  Measles, mumps, rubella (MMR) immunization. / You need at least 1 dose of MMR if you were born in 1957 or later. You may also need a second dose.  Meningococcal immunization. / 1 dose if you are age 19 to 21 and a first-year college student living in a residence hall, or have one of several medical conditions, you need to get vaccinated against meningococcal disease. You may also need additional booster doses.  Varicella immunization.** / Consult your caregiver.  Hepatitis A immunization.** / Consult your caregiver. 2 doses, 6 to 18 months apart.  Hepatitis B immunization.** / Consult your caregiver. 3 doses usually over 6 months. Ages 40 to 64  Blood pressure check.** / Every 1 to 2 years.  Lipid and cholesterol check.** / Every 5 years beginning at age 20.  Clinical breast exam.** / Every year after age 40.  Mammogram.** / Every year beginning at age 40   and continuing for as long as you are in good health. Consult with your  caregiver.  Pap test.** / Every 3 years starting at age 30 through age 65 or 70 with a history of 3 consecutive normal Pap tests.  HPV screening.** / Every 3 years from ages 30 through ages 65 to 70 with a history of 3 consecutive normal Pap tests.  Fecal occult blood test (FOBT) of stool. / Every year beginning at age 50 and continuing until age 75. You may not need to do this test if you get a colonoscopy every 10 years.  Flexible sigmoidoscopy or colonoscopy.** / Every 5 years for a flexible sigmoidoscopy or every 10 years for a colonoscopy beginning at age 50 and continuing until age 75.  Hepatitis C blood test.** / For all people born from 1945 through 1965 and any individual with known risks for hepatitis C.  Skin self-exam. / Monthly.  Influenza immunization.** / Every year.  Pneumococcal polysaccharide immunization.** / 1 to 2 doses if you smoke cigarettes or if you have certain chronic medical conditions.  Tetanus, diphtheria, pertussis (Tdap, Td) immunization.** / A one-time dose of Tdap vaccine. After that, you need a Td booster dose every 10 years.  Measles, mumps, rubella (MMR) immunization. / You need at least 1 dose of MMR if you were born in 1957 or later. You may also need a second dose.  Varicella immunization.** / Consult your caregiver.  Meningococcal immunization.** / Consult your caregiver.  Hepatitis A immunization.** / Consult your caregiver. 2 doses, 6 to 18 months apart.  Hepatitis B immunization.** / Consult your caregiver. 3 doses, usually over 6 months. Ages 65 and over  Blood pressure check.** / Every 1 to 2 years.  Lipid and cholesterol check.** / Every 5 years beginning at age 20.  Clinical breast exam.** / Every year after age 40.  Mammogram.** / Every year beginning at age 40 and continuing for as long as you are in good health. Consult with your caregiver.  Pap test.** / Every 3 years starting at age 30 through age 65 or 70 with a 3  consecutive normal Pap tests. Testing can be stopped between 65 and 70 with 3 consecutive normal Pap tests and no abnormal Pap or HPV tests in the past 10 years.  HPV screening.** / Every 3 years from ages 30 through ages 65 or 70 with a history of 3 consecutive normal Pap tests. Testing can be stopped between 65 and 70 with 3 consecutive normal Pap tests and no abnormal Pap or HPV tests in the past 10 years.  Fecal occult blood test (FOBT) of stool. / Every year beginning at age 50 and continuing until age 75. You may not need to do this test if you get a colonoscopy every 10 years.  Flexible sigmoidoscopy or colonoscopy.** / Every 5 years for a flexible sigmoidoscopy or every 10 years for a colonoscopy beginning at age 50 and continuing until age 75.  Hepatitis C blood test.** / For all people born from 1945 through 1965 and any individual with known risks for hepatitis C.  Osteoporosis screening.** / A one-time screening for women ages 65 and over and women at risk for fractures or osteoporosis.  Skin self-exam. / Monthly.  Influenza immunization.** / Every year.  Pneumococcal polysaccharide immunization.** / 1 dose at age 65 (or older) if you have never been vaccinated.  Tetanus, diphtheria, pertussis (Tdap, Td) immunization. / A one-time dose of Tdap vaccine if you are over   65 and have contact with an infant, are a healthcare worker, or simply want to be protected from whooping cough. After that, you need a Td booster dose every 10 years.  Varicella immunization.** / Consult your caregiver.  Meningococcal immunization.** / Consult your caregiver.  Hepatitis A immunization.** / Consult your caregiver. 2 doses, 6 to 18 months apart.  Hepatitis B immunization.** / Check with your caregiver. 3 doses, usually over 6 months. ** Family history and personal history of risk and conditions may change your caregiver's recommendations. Document Released: 02/11/2002 Document Revised: 03/09/2012  Document Reviewed: 05/13/2011 [employer] Patient Information 2014 [employer].  

## 2013-10-08 NOTE — Assessment & Plan Note (Signed)
Check labs Cont' meds 

## 2013-10-29 ENCOUNTER — Other Ambulatory Visit: Payer: Self-pay | Admitting: Family Medicine

## 2013-11-09 ENCOUNTER — Ambulatory Visit (HOSPITAL_COMMUNITY)
Admission: RE | Admit: 2013-11-09 | Discharge: 2013-11-09 | Disposition: A | Discharge status: Home / Self Care | Payer: BC Managed Care – PPO | Source: Ambulatory Visit | Attending: Family Medicine | Admitting: Family Medicine

## 2013-11-09 DIAGNOSIS — Z1231 Encounter for screening mammogram for malignant neoplasm of breast: Secondary | ICD-10-CM

## 2013-11-09 LAB — HM MAMMOGRAPHY: HM Mammogram: NORMAL

## 2013-12-09 ENCOUNTER — Other Ambulatory Visit: Payer: Self-pay | Admitting: Family Medicine

## 2014-06-10 ENCOUNTER — Ambulatory Visit (INDEPENDENT_AMBULATORY_CARE_PROVIDER_SITE_OTHER): Payer: BC Managed Care – PPO | Admitting: Family Medicine

## 2014-06-10 ENCOUNTER — Encounter: Payer: Self-pay | Admitting: Family Medicine

## 2014-06-10 VITALS — BP 116/74 | HR 74 | Temp 98.4°F | Wt 164.8 lb

## 2014-06-10 DIAGNOSIS — E785 Hyperlipidemia, unspecified: Secondary | ICD-10-CM

## 2014-06-10 LAB — BASIC METABOLIC PANEL
BUN: 14 mg/dL (ref 6–23)
CHLORIDE: 104 meq/L (ref 96–112)
CO2: 28 meq/L (ref 19–32)
Calcium: 9.4 mg/dL (ref 8.4–10.5)
Creatinine, Ser: 0.7 mg/dL (ref 0.4–1.2)
GFR: 107.13 mL/min (ref >60.00)
GLUCOSE: 108 mg/dL — AB (ref 70–99)
POTASSIUM: 4.2 meq/L (ref 3.5–5.1)
SODIUM: 138 meq/L (ref 135–145)

## 2014-06-10 LAB — HEPATIC FUNCTION PANEL
ALBUMIN: 4 g/dL (ref 3.5–5.2)
ALK PHOS: 55 U/L (ref 39–117)
ALT: 18 U/L (ref 0–35)
AST: 20 U/L (ref 0–37)
Bilirubin, Direct: 0.1 mg/dL (ref 0.0–0.3)
TOTAL PROTEIN: 7.9 g/dL (ref 6.0–8.3)
Total Bilirubin: 0.9 mg/dL (ref 0.2–1.2)

## 2014-06-10 LAB — LIPID PANEL
Cholesterol: 187 mg/dL (ref 0–200)
HDL: 40.9 mg/dL (ref >39.00)
LDL Cholesterol: 126 mg/dL — ABNORMAL HIGH (ref 0–99)
NONHDL: 146.1
Total CHOL/HDL Ratio: 5
Triglycerides: 102 mg/dL (ref 0.0–149.0)
VLDL: 20.4 mg/dL (ref 0.0–40.0)

## 2014-06-10 NOTE — Progress Notes (Signed)
Subjective:    Joann Townsend is a 58 y.o. female here for follow up of dyslipidemia. The patient does not use medications that may worsen dyslipidemias (corticosteroids, progestins, anabolic steroids, diuretics, beta-blockers, amiodarone, cyclosporine, olanzapine). The patient exercises rarely. The patient is not known to have coexisting coronary artery disease.   Cardiac Risk Factors Age > 45-female, > 55-female:  YES  +1  Smoking:   NO  Sig. family hx of CHD*:  NO  Hypertension:   NO  Diabetes:   NO  HDL < 35:   NO  HDL > 59:   NO  Total: 1   *- Sig. family h/o CHD per NCEP = MI or sudden death at <55yo in  father or other 1st-degree female relative, or <65yo in mother or  other 1st-degree female relative  The following portions of the patient's history were reviewed and updated as appropriate:  She  has a past medical history of Anal fissure; Hyperlipidemia; Internal hemorrhoids; and Colon polyp (2004). She  does not have any pertinent problems on file. She  has past surgical history that includes Abdominal hysterectomy; Colonoscopy w/ polypectomy; Anal fissure repair; and Cesarean section. Her family history includes Alcohol abuse in her father; Diabetes in her brother and mother; Heart disease in her father; Hypertension in her mother. She  reports that she has never smoked. She has never used smokeless tobacco. She reports that she does not drink alcohol or use illicit drugs. She has a current medication list which includes the following prescription(s): atorvastatin and climara. Current Outpatient Prescriptions on File Prior to Visit  Medication Sig Dispense Refill  . atorvastatin (LIPITOR) 10 MG tablet take 1 tablet by mouth at bedtime  30 tablet  5  . CLIMARA 0.075 MG/24HR patch place 1 patch ONTO THE SKIN ONCE A WEEK  4 patch  12   No current facility-administered medications on file prior to visit.   She has No Known Allergies..  Review of Systems Pertinent items are  noted in HPI.    Objective:    BP 116/74  Pulse 74  Temp(Src) 98.4 F (36.9 C) (Oral)  Wt 164 lb 12.8 oz (74.753 kg)  SpO2 99% General appearance: alert, cooperative, appears stated age and no distress Throat: lips, mucosa, and tongue normal; teeth and gums normal Neck: no adenopathy, supple, symmetrical, trachea midline and thyroid not enlarged, symmetric, no tenderness/mass/nodules Lungs: clear to auscultation bilaterally Heart: regular rate and rhythm, S1, S2 normal, no murmur, click, rub or gallop Extremities: extremities normal, atraumatic, no cyanosis or edema  Lab Review Lab Results  Component Value Date   CHOL 147 10/08/2013   CHOL 138 10/02/2012   CHOL 166 04/03/2012   HDL 40.70 10/08/2013   HDL 39.20 10/02/2012   HDL 41.40 04/03/2012      Assessment:    Dyslipidemia as detailed above with 1 CHD risk factors using NCEP scheme above.  Target levels for LDL are: < 100 mg/dl (CHD or "CHD risk equivalent" is present)  Explained to the patient the respective contributions of genetics, diet, and exercise to lipid levels and the use of medication in severe cases which do not respond to lifestyle alteration. The patient's interest and motivation in making lifestyle changes seems good.    Plan:    The following changes are planned for the next 6 months, at which time the patient will return for repeat fasting lipids:  1. Dietary changes: Increase soluble fiber Plant sterols 2grams per day (e.g. Benecol): yes Reduce saturated  fat, "trans" monounsaturated fatty acids, and cholesterol 2. Exercise changes:  exercise at least 3 days a week 3. Other treatment: Weight reduction (-) 4. Lipid-lowering medications: Atorvastatin 10mg  QHS  (Recommended by NCEP after 3-6 mos of dietary therapy & lifestyle modification,  except if CHD is present or LDL well above 190.) 5. Hormone replacement therapy (patient is a postmenopausal  woman): yes - climara 6. Screening for secondary causes of  dyslipidemias: None indicated 7. Lipid screening for relatives:  8. Follow up: 6 months.  Note: The majority of the visit was spent in counseling on the pathophysiology and treatment of dyslipidemias. The total face-to-face time was in excess of 20 minutes.

## 2014-06-10 NOTE — Patient Instructions (Signed)

## 2014-06-10 NOTE — Progress Notes (Signed)
Pre visit review using our clinic review tool, if applicable. No additional management support is needed unless otherwise documented below in the visit note. 

## 2014-06-16 MED ORDER — ATORVASTATIN CALCIUM 20 MG PO TABS: ORAL_TABLET | ORAL | Status: DC

## 2014-09-13 ENCOUNTER — Telehealth: Payer: Self-pay

## 2014-09-13 DIAGNOSIS — E785 Hyperlipidemia, unspecified: Secondary | ICD-10-CM

## 2014-09-13 NOTE — Telephone Encounter (Signed)
Message copied by Ewing Schlein on Tue Sep 13, 2014  6:32 PM ------      Message from: Irven [place]      Created: Tue Sep 13, 2014  3:06 PM       272.4 Lipid, hep            Patient scheduled lab appt for 09/23/14 ------

## 2014-09-13 NOTE — Telephone Encounter (Signed)
Orders in..     KP 

## 2014-09-19 ENCOUNTER — Other Ambulatory Visit: Payer: Self-pay | Admitting: Family Medicine

## 2014-09-19 DIAGNOSIS — Z1231 Encounter for screening mammogram for malignant neoplasm of breast: Secondary | ICD-10-CM

## 2014-09-23 ENCOUNTER — Other Ambulatory Visit (INDEPENDENT_AMBULATORY_CARE_PROVIDER_SITE_OTHER): Payer: BC Managed Care – PPO

## 2014-09-23 DIAGNOSIS — E785 Hyperlipidemia, unspecified: Secondary | ICD-10-CM

## 2014-09-23 LAB — HEPATIC FUNCTION PANEL
ALBUMIN: 4 g/dL (ref 3.5–5.2)
ALK PHOS: 63 U/L (ref 39–117)
ALT: 17 U/L (ref 0–35)
AST: 22 U/L (ref 0–37)
BILIRUBIN TOTAL: 0.4 mg/dL (ref 0.2–1.2)
Bilirubin, Direct: 0 mg/dL (ref 0.0–0.3)
Total Protein: 7.7 g/dL (ref 6.0–8.3)

## 2014-09-23 LAB — LIPID PANEL
CHOL/HDL RATIO: 5
Cholesterol: 223 mg/dL — ABNORMAL HIGH (ref 0–200)
HDL: 42.1 mg/dL (ref >39.00)
LDL Cholesterol: 160 mg/dL — ABNORMAL HIGH (ref 0–99)
NONHDL: 180.9
Triglycerides: 104 mg/dL (ref 0.0–149.0)
VLDL: 20.8 mg/dL (ref 0.0–40.0)

## 2014-09-30 ENCOUNTER — Telehealth: Payer: Self-pay | Admitting: Family Medicine

## 2014-09-30 NOTE — Telephone Encounter (Signed)
Caller name: Keisy  Relation to pt: self Ca back number: [phone]   Reason for call: pt inquring about lab results please mail

## 2014-09-30 NOTE — Telephone Encounter (Signed)
Cholesterol--- LDL goal < 100, HDL >40, TG < 150. Diet and exercise will increase HDL and decrease LDL and TG. Fish, Fish Oil, Flaxseed oil will also help increase the HDL and decrease Triglycerides. Recheck labs in 3 months-----inc lipitor 40 mg #30 1 po qhs, 2 refills 272.4 lipid hep.

## 2014-10-03 MED ORDER — ATORVASTATIN CALCIUM 20 MG PO TABS: ORAL_TABLET | ORAL | Status: DC

## 2014-10-03 MED ORDER — ROSUVASTATIN CALCIUM 20 MG PO TABS: 20.0000 mg | ORAL_TABLET | Freq: Every day | ORAL | Status: DC

## 2014-10-03 NOTE — Telephone Encounter (Signed)
discussed the results with the patient and she said she had been taking her medications regularly, she stated she would start back but did not want to increase at this time. Rx sent for the 20 mg and a copy of the labs have been mailed.      KP

## 2014-10-03 NOTE — Addendum Note (Signed)
Addended by: Ewing Schlein on: 10/03/2014 01:41 PM   Modules accepted: Orders

## 2014-10-03 NOTE — Telephone Encounter (Signed)
She can try Crestor 20 mg tabs 1 tab po qhs disp #30 with 2 rf. She may get some kick back from her insurance because she has not failed 2 other statins but she can try.

## 2014-10-03 NOTE — Addendum Note (Signed)
Addended by: Ewing Schlein on: 10/03/2014 09:15 AM   Modules accepted: Orders

## 2014-10-03 NOTE — Telephone Encounter (Signed)
Patient called back and stated she wanted to get the Crestor instead of the Lipitor. Please advise     KP

## 2014-10-03 NOTE — Telephone Encounter (Signed)
Detailed message left advising the Crestor has been sent.   KP

## 2014-10-03 NOTE — Telephone Encounter (Signed)
Msg left to call the office     KP 

## 2014-11-10 ENCOUNTER — Ambulatory Visit (HOSPITAL_COMMUNITY): Payer: BC Managed Care – PPO

## 2014-11-25 ENCOUNTER — Ambulatory Visit (HOSPITAL_COMMUNITY)
Admission: RE | Admit: 2014-11-25 | Discharge: 2014-11-25 | Disposition: A | Discharge status: Home / Self Care | Payer: BC Managed Care – PPO | Source: Ambulatory Visit | Attending: Family Medicine | Admitting: Family Medicine

## 2014-11-25 DIAGNOSIS — Z1231 Encounter for screening mammogram for malignant neoplasm of breast: Secondary | ICD-10-CM | POA: Insufficient documentation

## 2014-12-16 ENCOUNTER — Other Ambulatory Visit (INDEPENDENT_AMBULATORY_CARE_PROVIDER_SITE_OTHER): Payer: BC Managed Care – PPO

## 2014-12-16 DIAGNOSIS — E785 Hyperlipidemia, unspecified: Secondary | ICD-10-CM

## 2014-12-16 LAB — LIPID PANEL
Cholesterol: 122 mg/dL (ref 0–200)
HDL: 33.4 mg/dL — AB (ref >39.00)
LDL Cholesterol: 67 mg/dL (ref 0–99)
NonHDL: 88.6
Total CHOL/HDL Ratio: 4
Triglycerides: 110 mg/dL (ref 0.0–149.0)
VLDL: 22 mg/dL (ref 0.0–40.0)

## 2015-01-31 ENCOUNTER — Other Ambulatory Visit: Payer: Self-pay

## 2015-01-31 MED ORDER — ROSUVASTATIN CALCIUM 20 MG PO TABS: 20.0000 mg | ORAL_TABLET | Freq: Every day | ORAL | Status: DC

## 2015-03-27 ENCOUNTER — Other Ambulatory Visit: Payer: Self-pay | Admitting: Family Medicine

## 2015-03-28 NOTE — Telephone Encounter (Signed)
Please schedule Joann Townsend for a CPE.        KP

## 2015-03-30 NOTE — Telephone Encounter (Signed)
LM for patient to call back regarding scheduling physical appointment

## 2015-06-23 ENCOUNTER — Telehealth: Payer: Self-pay | Admitting: Family Medicine

## 2015-06-23 NOTE — Telephone Encounter (Signed)
pre visit letter mailed 06/23/15

## 2015-07-13 ENCOUNTER — Telehealth: Payer: Self-pay | Admitting: Behavioral Health

## 2015-07-13 NOTE — Telephone Encounter (Signed)
Unable to reach patient at time of Pre-Visit Call.  Left message for patient to return call when available.    

## 2015-07-14 ENCOUNTER — Encounter: Payer: Self-pay | Admitting: Family Medicine

## 2015-07-14 ENCOUNTER — Other Ambulatory Visit (HOSPITAL_COMMUNITY)
Admission: RE | Admit: 2015-07-14 | Discharge: 2015-07-14 | Disposition: A | Discharge status: Home / Self Care | Payer: BLUE CROSS/BLUE SHIELD | Source: Ambulatory Visit | Attending: Family Medicine | Admitting: Family Medicine

## 2015-07-14 ENCOUNTER — Ambulatory Visit (INDEPENDENT_AMBULATORY_CARE_PROVIDER_SITE_OTHER): Payer: BLUE CROSS/BLUE SHIELD | Admitting: Family Medicine

## 2015-07-14 VITALS — BP 158/100 | HR 68 | Temp 98.1°F | Ht 60.5 in | Wt 169.2 lb

## 2015-07-14 DIAGNOSIS — Z0000 Encounter for general adult medical examination without abnormal findings: Secondary | ICD-10-CM

## 2015-07-14 DIAGNOSIS — N951 Menopausal and female climacteric states: Secondary | ICD-10-CM | POA: Diagnosis not present

## 2015-07-14 DIAGNOSIS — Z124 Encounter for screening for malignant neoplasm of cervix: Secondary | ICD-10-CM | POA: Diagnosis not present

## 2015-07-14 DIAGNOSIS — I10 Essential (primary) hypertension: Secondary | ICD-10-CM

## 2015-07-14 DIAGNOSIS — Z01419 Encounter for gynecological examination (general) (routine) without abnormal findings: Secondary | ICD-10-CM | POA: Insufficient documentation

## 2015-07-14 LAB — CBC WITH DIFFERENTIAL/PLATELET
BASOS ABS: 0 10*3/uL (ref 0.0–0.1)
Basophils Relative: 0.3 % (ref 0.0–3.0)
EOS ABS: 0.1 10*3/uL (ref 0.0–0.7)
Eosinophils Relative: 2.9 % (ref 0.0–5.0)
HCT: 41.2 % (ref 36.0–46.0)
HEMOGLOBIN: 13.4 g/dL (ref 12.0–15.0)
LYMPHS ABS: 1.7 10*3/uL (ref 0.7–4.0)
LYMPHS PCT: 43.7 % (ref 12.0–46.0)
MCHC: 32.6 g/dL (ref 30.0–36.0)
MCV: 84.4 fl (ref 78.0–100.0)
MONOS PCT: 13.5 % — AB (ref 3.0–12.0)
Monocytes Absolute: 0.5 10*3/uL (ref 0.1–1.0)
NEUTROS ABS: 1.6 10*3/uL (ref 1.4–7.7)
Neutrophils Relative %: 39.6 % — ABNORMAL LOW (ref 43.0–77.0)
Platelets: 192 10*3/uL (ref 150.0–400.0)
RBC: 4.88 Mil/uL (ref 3.87–5.11)
RDW: 13.9 % (ref 11.5–15.5)
WBC: 4 10*3/uL (ref 4.0–10.5)

## 2015-07-14 LAB — BASIC METABOLIC PANEL
BUN: 12 mg/dL (ref 6–23)
CHLORIDE: 102 meq/L (ref 96–112)
CO2: 29 meq/L (ref 19–32)
Calcium: 9.1 mg/dL (ref 8.4–10.5)
Creatinine, Ser: 0.73 mg/dL (ref 0.40–1.20)
GFR: 105.04 mL/min (ref >60.00)
GLUCOSE: 101 mg/dL — AB (ref 70–99)
Potassium: 3.7 mEq/L (ref 3.5–5.1)
SODIUM: 137 meq/L (ref 135–145)

## 2015-07-14 LAB — HEPATIC FUNCTION PANEL
ALT: 16 U/L (ref 0–35)
AST: 20 U/L (ref 0–37)
Albumin: 4.1 g/dL (ref 3.5–5.2)
Alkaline Phosphatase: 68 U/L (ref 39–117)
Bilirubin, Direct: 0.2 mg/dL (ref 0.0–0.3)
Total Bilirubin: 0.7 mg/dL (ref 0.2–1.2)
Total Protein: 7.6 g/dL (ref 6.0–8.3)

## 2015-07-14 LAB — LIPID PANEL
CHOLESTEROL: 149 mg/dL (ref 0–200)
HDL: 41.2 mg/dL (ref >39.00)
LDL CALC: 86 mg/dL (ref 0–99)
NONHDL: 107.8
Total CHOL/HDL Ratio: 4
Triglycerides: 109 mg/dL (ref 0.0–149.0)
VLDL: 21.8 mg/dL (ref 0.0–40.0)

## 2015-07-14 LAB — POCT URINALYSIS DIPSTICK
Bilirubin, UA: NEGATIVE
Blood, UA: NEGATIVE
Glucose, UA: NEGATIVE
KETONES UA: NEGATIVE
LEUKOCYTES UA: NEGATIVE
Nitrite, UA: NEGATIVE
Protein, UA: NEGATIVE
Spec Grav, UA: 1.02
Urobilinogen, UA: 0.2
pH, UA: 6.5

## 2015-07-14 LAB — MICROALBUMIN / CREATININE URINE RATIO
Creatinine,U: 94.8 mg/dL
Microalb Creat Ratio: 0.7 mg/g (ref 0.0–30.0)
Microalb, Ur: <0.7 mg/dL (ref 0.0–1.9)

## 2015-07-14 LAB — TSH: TSH: 1.56 u[IU]/mL (ref 0.35–4.50)

## 2015-07-14 MED ORDER — CLIMARA 0.075 MG/24HR TD PTWK: MEDICATED_PATCH | TRANSDERMAL | Status: DC

## 2015-07-14 MED ORDER — HYDROCHLOROTHIAZIDE 25 MG PO TABS: 25.0000 mg | ORAL_TABLET | Freq: Every day | ORAL | Status: DC

## 2015-07-14 NOTE — Progress Notes (Signed)
Pre visit review using our clinic review tool, if applicable. No additional management support is needed unless otherwise documented below in the visit note. 

## 2015-07-14 NOTE — Progress Notes (Signed)
Subjective:     Joann Townsend is a 59 y.o. female and is here for a comprehensive physical exam. The patient reports no problems.  History   Social History  . Marital Status: Married    Spouse Name: N/A  . Number of Children: 3  . Years of Education: N/A   Occupational History  . [employer]    Social History Main Topics  . Smoking status: Never Smoker   . Smokeless tobacco: Never Used  . Alcohol Use: No  . Drug Use: No  . Sexual Activity:    Partners: Male   Other Topics Concern  . Not on file   Social History Narrative   Exercise-- occasional   Health Maintenance  Topic Date Due  . HIV Screening  07/13/2016 (Originally 10/24/1971)  . INFLUENZA VACCINE  07/31/2015  . PAP SMEAR  10/03/2015  . MAMMOGRAM  11/25/2016  . COLONOSCOPY  01/29/2018  . TETANUS/TDAP  10/09/2023    The following portions of the patient's history were reviewed and updated as appropriate:  She  has a past medical history of Anal fissure; Hyperlipidemia; Internal hemorrhoids; and Colon polyp (2004). She  does not have any pertinent problems on file. She  has past surgical history that includes Abdominal hysterectomy; Colonoscopy w/ polypectomy; Anal fissure repair; and Cesarean section. Her family history includes Alcohol abuse in her father; Diabetes in her brother and mother; Heart disease in her father; Hypertension in her mother. She  reports that she has never smoked. She has never used smokeless tobacco. She reports that she does not drink alcohol or use illicit drugs. She has a current medication list which includes the following prescription(s): climara and rosuvastatin. Current Outpatient Prescriptions on File Prior to Visit  Medication Sig Dispense Refill  . CLIMARA 0.075 MG/24HR patch PLACE ONE PATCH ONTO THE SKIN ONCE A WEEK 12 patch 0  . rosuvastatin (CRESTOR) 20 MG tablet Take 1 tablet (20 mg total) by mouth daily. 30 tablet 5   No current facility-administered  medications on file prior to visit.   She has No Known Allergies..  Review of Systems Review of Systems  Constitutional: Negative for activity change, appetite change and fatigue.  HENT: Negative for hearing loss, congestion, tinnitus and ear discharge.  dentist q51m Eyes: Negative for visual disturbance (see optho q1y -- vision corrected to 20/20 with glasses).  Respiratory: Negative for cough, chest tightness and shortness of breath.   Cardiovascular: Negative for chest pain, palpitations and leg swelling.  Gastrointestinal: Negative for abdominal pain, diarrhea, constipation and abdominal distention.  Genitourinary: Negative for urgency, frequency, decreased urine volume and difficulty urinating.  Musculoskeletal: Negative for back pain, arthralgias and gait problem.  Skin: Negative for color change, pallor and rash.  Neurological: Negative for dizziness, light-headedness, numbness and headaches.  Hematological: Negative for adenopathy. Does not bruise/bleed easily.  Psychiatric/Behavioral: Negative for suicidal ideas, confusion, sleep disturbance, self-injury, dysphoric mood, decreased concentration and agitation.       Objective:    BP 158/100 mmHg  Pulse 68  Temp(Src) 98.1 F (36.7 C) (Oral)  Ht 5' 0.5" (1.537 m)  Wt 169 lb 3.2 oz (76.749 kg)  BMI 32.49 kg/m2  SpO2 99% General appearance: alert, cooperative, appears stated age and no distress Head: Normocephalic, without obvious abnormality, atraumatic Eyes: conjunctivae/corneas clear. PERRL, EOM's intact. Fundi benign. Ears: normal TM's and external ear canals both ears Nose: Nares normal. Septum midline. Mucosa normal. No drainage or sinus tenderness. Throat: lips, mucosa, and tongue normal; teeth  and gums normal Neck: no adenopathy, no carotid bruit, no JVD, supple, symmetrical, trachea midline and thyroid not enlarged, symmetric, no tenderness/mass/nodules Back: symmetric, no curvature. ROM normal. No CVA  tenderness. Lungs: clear to auscultation bilaterally Breasts: normal appearance, no masses or tenderness Heart: regular rate and rhythm, S1, S2 normal, no murmur, click, rub or gallop Abdomen: soft, non-tender; bowel sounds normal; no masses,  no organomegaly Pelvic: external genitalia normal, no adnexal masses or tenderness, uterus surgically absent, vagina normal without discharge and vaginal smear done,  rectal refused Extremities: extremities normal, atraumatic, no cyanosis or edema Pulses: 2+ and symmetric Skin: Skin color, texture, turgor normal. No rashes or lesions Lymph nodes: Cervical, supraclavicular, and axillary nodes normal. Neurologic: Alert and oriented X 3, normal strength and tone. Normal symmetric reflexes. Normal coordination and gait Psych-- no depression, no anxiety      Assessment:    Healthy female exam.      Plan:    ghm utd Check labs See After Visit Summary for Counseling Recommendations   1. Essential hypertension Exercise,  Diet , recheck 2 weeks - EKG [id number] - Basic metabolic panel - CBC with Differential/Platelet - Hepatic function panel - Lipid panel - POCT urinalysis dipstick - TSH - Microalbumin / creatinine urine ratio - hydrochlorothiazide (HYDRODIURIL) 25 MG tablet; Take 1 tablet (25 mg total) by mouth daily. 1 po qd prn  Dispense: 30 tablet; Refill: 11  2. Preventative health care See AVS - Basic metabolic panel - CBC with Differential/Platelet - Hepatic function panel - Lipid panel - POCT urinalysis dipstick - TSH - Microalbumin / creatinine urine ratio - Type and screen; Future  3. Menopausal symptom  - CLIMARA 0.075 MG/24HR patch; PLACE ONE PATCH ONTO THE SKIN ONCE A WEEK  Dispense: 12 patch; Refill: 3

## 2015-07-14 NOTE — Patient Instructions (Signed)
Preventive Care for Adults A healthy lifestyle and preventive care can promote health and wellness. Preventive health guidelines for women include the following key practices.  A routine yearly physical is a good way to check with your health care provider about your health and preventive screening. It is a chance to share any concerns and updates on your health and to receive a thorough exam.  Visit your dentist for a routine exam and preventive care every 6 months. Brush your teeth twice a day and floss once a day. Good oral hygiene prevents tooth decay and gum disease.  The frequency of eye exams is based on your age, health, family medical history, use of contact lenses, and other factors. Follow your health care provider's recommendations for frequency of eye exams.  Eat a healthy diet. Foods like vegetables, fruits, whole grains, low-fat dairy products, and lean protein foods contain the nutrients you need without too many calories. Decrease your intake of foods high in solid fats, added sugars, and salt. Eat the right amount of calories for you.Get information about a proper diet from your health care provider, if necessary.  Regular physical exercise is one of the most important things you can do for your health. Most adults should get at least 150 minutes of moderate-intensity exercise (any activity that increases your heart rate and causes you to sweat) each week. In addition, most adults need muscle-strengthening exercises on 2 or more days a week.  Maintain a healthy weight. The body mass index (BMI) is a screening tool to identify possible weight problems. It provides an estimate of body fat based on height and weight. Your health care provider can find your BMI and can help you achieve or maintain a healthy weight.For adults 20 years and older:  A BMI below 18.5 is considered underweight.  A BMI of 18.5 to 24.9 is normal.  A BMI of 25 to 29.9 is considered overweight.  A BMI of  30 and above is considered obese.  Maintain normal blood lipids and cholesterol levels by exercising and minimizing your intake of saturated fat. Eat a balanced diet with plenty of fruit and vegetables. Blood tests for lipids and cholesterol should begin at age 76 and be repeated every 5 years. If your lipid or cholesterol levels are high, you are over 50, or you are at high risk for heart disease, you may need your cholesterol levels checked more frequently.Ongoing high lipid and cholesterol levels should be treated with medicines if diet and exercise are not working.  If you smoke, find out from your health care provider how to quit. If you do not use tobacco, do not start.  Lung cancer screening is recommended for adults aged 22-80 years who are at high risk for developing lung cancer because of a history of smoking. A yearly low-dose CT scan of the lungs is recommended for people who have at least a 30-pack-year history of smoking and are a current smoker or have quit within the past 15 years. A pack year of smoking is smoking an average of 1 pack of cigarettes a day for 1 year (for example: 1 pack a day for 30 years or 2 packs a day for 15 years). Yearly screening should continue until the smoker has stopped smoking for at least 15 years. Yearly screening should be stopped for people who develop a health problem that would prevent them from having lung cancer treatment.  If you are pregnant, do not drink alcohol. If you are breastfeeding,  be very cautious about drinking alcohol. If you are not pregnant and choose to drink alcohol, do not have more than 1 drink per day. One drink is considered to be 12 ounces (355 mL) of beer, 5 ounces (148 mL) of wine, or 1.5 ounces (44 mL) of liquor.  Avoid use of street drugs. Do not share needles with anyone. Ask for help if you need support or instructions about stopping the use of drugs.  High blood pressure causes heart disease and increases the risk of  stroke. Your blood pressure should be checked at least every 1 to 2 years. Ongoing high blood pressure should be treated with medicines if weight loss and exercise do not work.  If you are 75-52 years old, ask your health care provider if you should take aspirin to prevent strokes.  Diabetes screening involves taking a blood sample to check your fasting blood sugar level. This should be done once every 3 years, after age 15, if you are within normal weight and without risk factors for diabetes. Testing should be considered at a younger age or be carried out more frequently if you are overweight and have at least 1 risk factor for diabetes.  Breast cancer screening is essential preventive care for women. You should practice "breast self-awareness." This means understanding the normal appearance and feel of your breasts and may include breast self-examination. Any changes detected, no matter how small, should be reported to a health care provider. Women in their 58s and 30s should have a clinical breast exam (CBE) by a health care provider as part of a regular health exam every 1 to 3 years. After age 16, women should have a CBE every year. Starting at age 53, women should consider having a mammogram (breast X-ray test) every year. Women who have a family history of breast cancer should talk to their health care provider about genetic screening. Women at a high risk of breast cancer should talk to their health care providers about having an MRI and a mammogram every year.  Breast cancer gene (BRCA)-related cancer risk assessment is recommended for women who have family members with BRCA-related cancers. BRCA-related cancers include breast, ovarian, tubal, and peritoneal cancers. Having family members with these cancers may be associated with an increased risk for harmful changes (mutations) in the breast cancer genes BRCA1 and BRCA2. Results of the assessment will determine the need for genetic counseling and  BRCA1 and BRCA2 testing.  Routine pelvic exams to screen for cancer are no longer recommended for nonpregnant women who are considered low risk for cancer of the pelvic organs (ovaries, uterus, and vagina) and who do not have symptoms. Ask your health care provider if a screening pelvic exam is right for you.  If you have had past treatment for cervical cancer or a condition that could lead to cancer, you need Pap tests and screening for cancer for at least 20 years after your treatment. If Pap tests have been discontinued, your risk factors (such as having a new sexual partner) need to be reassessed to determine if screening should be resumed. Some women have medical problems that increase the chance of getting cervical cancer. In these cases, your health care provider may recommend more frequent screening and Pap tests.  The HPV test is an additional test that may be used for cervical cancer screening. The HPV test looks for the virus that can cause the cell changes on the cervix. The cells collected during the Pap test can be  tested for HPV. The HPV test could be used to screen women aged 30 years and older, and should be used in women of any age who have unclear Pap test results. After the age of 30, women should have HPV testing at the same frequency as a Pap test.  Colorectal cancer can be detected and often prevented. Most routine colorectal cancer screening begins at the age of 50 years and continues through age 75 years. However, your health care provider may recommend screening at an earlier age if you have risk factors for colon cancer. On a yearly basis, your health care provider may provide home test kits to check for hidden blood in the stool. Use of a small camera at the end of a tube, to directly examine the colon (sigmoidoscopy or colonoscopy), can detect the earliest forms of colorectal cancer. Talk to your health care provider about this at age 50, when routine screening begins. Direct  exam of the colon should be repeated every 5-10 years through age 75 years, unless early forms of pre-cancerous polyps or small growths are found.  People who are at an increased risk for hepatitis B should be screened for this virus. You are considered at high risk for hepatitis B if:  You were born in a country where hepatitis B occurs often. Talk with your health care provider about which countries are considered high risk.  Your parents were born in a high-risk country and you have not received a shot to protect against hepatitis B (hepatitis B vaccine).  You have HIV or AIDS.  You use needles to inject street drugs.  You live with, or have sex with, someone who has hepatitis B.  You get hemodialysis treatment.  You take certain medicines for conditions like cancer, organ transplantation, and autoimmune conditions.  Hepatitis C blood testing is recommended for all people born from 1945 through 1965 and any individual with known risks for hepatitis C.  Practice safe sex. Use condoms and avoid high-risk sexual practices to reduce the spread of sexually transmitted infections (STIs). STIs include gonorrhea, chlamydia, syphilis, trichomonas, herpes, HPV, and human immunodeficiency virus (HIV). Herpes, HIV, and HPV are viral illnesses that have no cure. They can result in disability, cancer, and death.  You should be screened for sexually transmitted illnesses (STIs) including gonorrhea and chlamydia if:  You are sexually active and are younger than 24 years.  You are older than 24 years and your health care provider tells you that you are at risk for this type of infection.  Your sexual activity has changed since you were last screened and you are at an increased risk for chlamydia or gonorrhea. Ask your health care provider if you are at risk.  If you are at risk of being infected with HIV, it is recommended that you take a prescription medicine daily to prevent HIV infection. This is  called preexposure prophylaxis (PrEP). You are considered at risk if:  You are a heterosexual woman, are sexually active, and are at increased risk for HIV infection.  You take drugs by injection.  You are sexually active with a partner who has HIV.  Talk with your health care provider about whether you are at high risk of being infected with HIV. If you choose to begin PrEP, you should first be tested for HIV. You should then be tested every 3 months for as long as you are taking PrEP.  Osteoporosis is a disease in which the bones lose minerals and strength   with aging. This can result in serious bone fractures or breaks. The risk of osteoporosis can be identified using a bone density scan. Women ages 65 years and over and women at risk for fractures or osteoporosis should discuss screening with their health care providers. Ask your health care provider whether you should take a calcium supplement or vitamin D to reduce the rate of osteoporosis.  Menopause can be associated with physical symptoms and risks. Hormone replacement therapy is available to decrease symptoms and risks. You should talk to your health care provider about whether hormone replacement therapy is right for you.  Use sunscreen. Apply sunscreen liberally and repeatedly throughout the day. You should seek shade when your shadow is shorter than you. Protect yourself by wearing long sleeves, pants, a wide-brimmed hat, and sunglasses year round, whenever you are outdoors.  Once a month, do a whole body skin exam, using a mirror to look at the skin on your back. Tell your health care provider of new moles, moles that have irregular borders, moles that are larger than a pencil eraser, or moles that have changed in shape or color.  Stay current with required vaccines (immunizations).  Influenza vaccine. All adults should be immunized every year.  Tetanus, diphtheria, and acellular pertussis (Td, Tdap) vaccine. Pregnant women should  receive 1 dose of Tdap vaccine during each pregnancy. The dose should be obtained regardless of the length of time since the last dose. Immunization is preferred during the 27th-36th week of gestation. An adult who has not previously received Tdap or who does not know her vaccine status should receive 1 dose of Tdap. This initial dose should be followed by tetanus and diphtheria toxoids (Td) booster doses every 10 years. Adults with an unknown or incomplete history of completing a 3-dose immunization series with Td-containing vaccines should begin or complete a primary immunization series including a Tdap dose. Adults should receive a Td booster every 10 years.  Varicella vaccine. An adult without evidence of immunity to varicella should receive 2 doses or a second dose if she has previously received 1 dose. Pregnant females who do not have evidence of immunity should receive the first dose after pregnancy. This first dose should be obtained before leaving the health care facility. The second dose should be obtained 4-8 weeks after the first dose.  Human papillomavirus (HPV) vaccine. Females aged 13-26 years who have not received the vaccine previously should obtain the 3-dose series. The vaccine is not recommended for use in pregnant females. However, pregnancy testing is not needed before receiving a dose. If a female is found to be pregnant after receiving a dose, no treatment is needed. In that case, the remaining doses should be delayed until after the pregnancy. Immunization is recommended for any person with an immunocompromised condition through the age of 26 years if she did not get any or all doses earlier. During the 3-dose series, the second dose should be obtained 4-8 weeks after the first dose. The third dose should be obtained 24 weeks after the first dose and 16 weeks after the second dose.  Zoster vaccine. One dose is recommended for adults aged 60 years or older unless certain conditions are  present.  Measles, mumps, and rubella (MMR) vaccine. Adults born before 1957 generally are considered immune to measles and mumps. Adults born in 1957 or later should have 1 or more doses of MMR vaccine unless there is a contraindication to the vaccine or there is laboratory evidence of immunity to   each of the three diseases. A routine second dose of MMR vaccine should be obtained at least 28 days after the first dose for students attending postsecondary schools, health care workers, or international travelers. People who received inactivated measles vaccine or an unknown type of measles vaccine during 1963-1967 should receive 2 doses of MMR vaccine. People who received inactivated mumps vaccine or an unknown type of mumps vaccine before 1979 and are at high risk for mumps infection should consider immunization with 2 doses of MMR vaccine. For females of childbearing age, rubella immunity should be determined. If there is no evidence of immunity, females who are not pregnant should be vaccinated. If there is no evidence of immunity, females who are pregnant should delay immunization until after pregnancy. Unvaccinated health care workers born before 1957 who lack laboratory evidence of measles, mumps, or rubella immunity or laboratory confirmation of disease should consider measles and mumps immunization with 2 doses of MMR vaccine or rubella immunization with 1 dose of MMR vaccine.  Pneumococcal 13-valent conjugate (PCV13) vaccine. When indicated, a person who is uncertain of her immunization history and has no record of immunization should receive the PCV13 vaccine. An adult aged 19 years or older who has certain medical conditions and has not been previously immunized should receive 1 dose of PCV13 vaccine. This PCV13 should be followed with a dose of pneumococcal polysaccharide (PPSV23) vaccine. The PPSV23 vaccine dose should be obtained at least 8 weeks after the dose of PCV13 vaccine. An adult aged 19  years or older who has certain medical conditions and previously received 1 or more doses of PPSV23 vaccine should receive 1 dose of PCV13. The PCV13 vaccine dose should be obtained 1 or more years after the last PPSV23 vaccine dose.  Pneumococcal polysaccharide (PPSV23) vaccine. When PCV13 is also indicated, PCV13 should be obtained first. All adults aged 65 years and older should be immunized. An adult younger than age 65 years who has certain medical conditions should be immunized. Any person who resides in a nursing home or long-term care facility should be immunized. An adult smoker should be immunized. People with an immunocompromised condition and certain other conditions should receive both PCV13 and PPSV23 vaccines. People with human immunodeficiency virus (HIV) infection should be immunized as soon as possible after diagnosis. Immunization during chemotherapy or radiation therapy should be avoided. Routine use of PPSV23 vaccine is not recommended for American Indians, [place] Natives, or people younger than 65 years unless there are medical conditions that require PPSV23 vaccine. When indicated, people who have unknown immunization and have no record of immunization should receive PPSV23 vaccine. One-time revaccination 5 years after the first dose of PPSV23 is recommended for people aged 19-64 years who have chronic kidney failure, nephrotic syndrome, asplenia, or immunocompromised conditions. People who received 1-2 doses of PPSV23 before age 65 years should receive another dose of PPSV23 vaccine at age 65 years or later if at least 5 years have passed since the previous dose. Doses of PPSV23 are not needed for people immunized with PPSV23 at or after age 65 years.  Meningococcal vaccine. Adults with asplenia or persistent complement component deficiencies should receive 2 doses of quadrivalent meningococcal conjugate (MenACWY-D) vaccine. The doses should be obtained at least 2 months apart.  Microbiologists working with certain meningococcal bacteria, military recruits, people at risk during an outbreak, and people who travel to or live in countries with a high rate of meningitis should be immunized. A first-year college student up through age   21 years who is living in a residence hall should receive a dose if she did not receive a dose on or after her 16th birthday. Adults who have certain high-risk conditions should receive one or more doses of vaccine.  Hepatitis A vaccine. Adults who wish to be protected from this disease, have certain high-risk conditions, work with hepatitis A-infected animals, work in hepatitis A research labs, or travel to or work in countries with a high rate of hepatitis A should be immunized. Adults who were previously unvaccinated and who anticipate close contact with an international adoptee during the first 60 days after arrival in the [place] from a country with a high rate of hepatitis A should be immunized.  Hepatitis B vaccine. Adults who wish to be protected from this disease, have certain high-risk conditions, may be exposed to blood or other infectious body fluids, are household contacts or sex partners of hepatitis B positive people, are clients or workers in certain care facilities, or travel to or work in countries with a high rate of hepatitis B should be immunized.  Haemophilus influenzae type b (Hib) vaccine. A previously unvaccinated person with asplenia or sickle cell disease or having a scheduled splenectomy should receive 1 dose of Hib vaccine. Regardless of previous immunization, a recipient of a hematopoietic stem cell transplant should receive a 3-dose series 6-12 months after her successful transplant. Hib vaccine is not recommended for adults with HIV infection. Preventive Services / Frequency Ages 64 to 68 years  Blood pressure check.** / Every 1 to 2 years.  Lipid and cholesterol check.** / Every 5 years beginning at age  22.  Clinical breast exam.** / Every 3 years for women in their 88s and 53s.  BRCA-related cancer risk assessment.** / For women who have family members with a BRCA-related cancer (breast, ovarian, tubal, or peritoneal cancers).  Pap test.** / Every 2 years from ages 90 through 51. Every 3 years starting at age 21 through age 56 or 3 with a history of 3 consecutive normal Pap tests.  HPV screening.** / Every 3 years from ages 24 through ages 1 to 46 with a history of 3 consecutive normal Pap tests.  Hepatitis C blood test.** / For any individual with known risks for hepatitis C.  Skin self-exam. / Monthly.  Influenza vaccine. / Every year.  Tetanus, diphtheria, and acellular pertussis (Tdap, Td) vaccine.** / Consult your health care provider. Pregnant women should receive 1 dose of Tdap vaccine during each pregnancy. 1 dose of Td every 10 years.  Varicella vaccine.** / Consult your health care provider. Pregnant females who do not have evidence of immunity should receive the first dose after pregnancy.  HPV vaccine. / 3 doses over 6 months, if 72 and younger. The vaccine is not recommended for use in pregnant females. However, pregnancy testing is not needed before receiving a dose.  Measles, mumps, rubella (MMR) vaccine.** / You need at least 1 dose of MMR if you were born in 1957 or later. You may also need a 2nd dose. For females of childbearing age, rubella immunity should be determined. If there is no evidence of immunity, females who are not pregnant should be vaccinated. If there is no evidence of immunity, females who are pregnant should delay immunization until after pregnancy.  Pneumococcal 13-valent conjugate (PCV13) vaccine.** / Consult your health care provider.  Pneumococcal polysaccharide (PPSV23) vaccine.** / 1 to 2 doses if you smoke cigarettes or if you have certain conditions.  Meningococcal vaccine.** /  1 dose if you are age 19 to 21 years and a first-year college  student living in a residence hall, or have one of several medical conditions, you need to get vaccinated against meningococcal disease. You may also need additional booster doses.  Hepatitis A vaccine.** / Consult your health care provider.  Hepatitis B vaccine.** / Consult your health care provider.  Haemophilus influenzae type b (Hib) vaccine.** / Consult your health care provider. Ages 40 to 64 years  Blood pressure check.** / Every 1 to 2 years.  Lipid and cholesterol check.** / Every 5 years beginning at age 20 years.  Lung cancer screening. / Every year if you are aged 55-80 years and have a 30-pack-year history of smoking and currently smoke or have quit within the past 15 years. Yearly screening is stopped once you have quit smoking for at least 15 years or develop a health problem that would prevent you from having lung cancer treatment.  Clinical breast exam.** / Every year after age 40 years.  BRCA-related cancer risk assessment.** / For women who have family members with a BRCA-related cancer (breast, ovarian, tubal, or peritoneal cancers).  Mammogram.** / Every year beginning at age 40 years and continuing for as long as you are in good health. Consult with your health care provider.  Pap test.** / Every 3 years starting at age 30 years through age 65 or 70 years with a history of 3 consecutive normal Pap tests.  HPV screening.** / Every 3 years from ages 30 years through ages 65 to 70 years with a history of 3 consecutive normal Pap tests.  Fecal occult blood test (FOBT) of stool. / Every year beginning at age 50 years and continuing until age 75 years. You may not need to do this test if you get a colonoscopy every 10 years.  Flexible sigmoidoscopy or colonoscopy.** / Every 5 years for a flexible sigmoidoscopy or every 10 years for a colonoscopy beginning at age 50 years and continuing until age 75 years.  Hepatitis C blood test.** / For all people born from 1945 through  1965 and any individual with known risks for hepatitis C.  Skin self-exam. / Monthly.  Influenza vaccine. / Every year.  Tetanus, diphtheria, and acellular pertussis (Tdap/Td) vaccine.** / Consult your health care provider. Pregnant women should receive 1 dose of Tdap vaccine during each pregnancy. 1 dose of Td every 10 years.  Varicella vaccine.** / Consult your health care provider. Pregnant females who do not have evidence of immunity should receive the first dose after pregnancy.  Zoster vaccine.** / 1 dose for adults aged 60 years or older.  Measles, mumps, rubella (MMR) vaccine.** / You need at least 1 dose of MMR if you were born in 1957 or later. You may also need a 2nd dose. For females of childbearing age, rubella immunity should be determined. If there is no evidence of immunity, females who are not pregnant should be vaccinated. If there is no evidence of immunity, females who are pregnant should delay immunization until after pregnancy.  Pneumococcal 13-valent conjugate (PCV13) vaccine.** / Consult your health care provider.  Pneumococcal polysaccharide (PPSV23) vaccine.** / 1 to 2 doses if you smoke cigarettes or if you have certain conditions.  Meningococcal vaccine.** / Consult your health care provider.  Hepatitis A vaccine.** / Consult your health care provider.  Hepatitis B vaccine.** / Consult your health care provider.  Haemophilus influenzae type b (Hib) vaccine.** / Consult your health care provider. Ages 65   years and over  Blood pressure check.** / Every 1 to 2 years.  Lipid and cholesterol check.** / Every 5 years beginning at age 22 years.  Lung cancer screening. / Every year if you are aged 73-80 years and have a 30-pack-year history of smoking and currently smoke or have quit within the past 15 years. Yearly screening is stopped once you have quit smoking for at least 15 years or develop a health problem that would prevent you from having lung cancer  treatment.  Clinical breast exam.** / Every year after age 4 years.  BRCA-related cancer risk assessment.** / For women who have family members with a BRCA-related cancer (breast, ovarian, tubal, or peritoneal cancers).  Mammogram.** / Every year beginning at age 40 years and continuing for as long as you are in good health. Consult with your health care provider.  Pap test.** / Every 3 years starting at age 9 years through age 34 or 91 years with 3 consecutive normal Pap tests. Testing can be stopped between 65 and 70 years with 3 consecutive normal Pap tests and no abnormal Pap or HPV tests in the past 10 years.  HPV screening.** / Every 3 years from ages 57 years through ages 64 or 45 years with a history of 3 consecutive normal Pap tests. Testing can be stopped between 65 and 70 years with 3 consecutive normal Pap tests and no abnormal Pap or HPV tests in the past 10 years.  Fecal occult blood test (FOBT) of stool. / Every year beginning at age 15 years and continuing until age 17 years. You may not need to do this test if you get a colonoscopy every 10 years.  Flexible sigmoidoscopy or colonoscopy.** / Every 5 years for a flexible sigmoidoscopy or every 10 years for a colonoscopy beginning at age 86 years and continuing until age 71 years.  Hepatitis C blood test.** / For all people born from [birth year] through 1965 and any individual with known risks for hepatitis C.  Osteoporosis screening.** / A one-time screening for women ages 83 years and over and women at risk for fractures or osteoporosis.  Skin self-exam. / Monthly.  Influenza vaccine. / Every year.  Tetanus, diphtheria, and acellular pertussis (Tdap/Td) vaccine.** / 1 dose of Td every 10 years.  Varicella vaccine.** / Consult your health care provider.  Zoster vaccine.** / 1 dose for adults aged 61 years or older.  Pneumococcal 13-valent conjugate (PCV13) vaccine.** / Consult your health care provider.  Pneumococcal  polysaccharide (PPSV23) vaccine.** / 1 dose for all adults aged 28 years and older.  Meningococcal vaccine.** / Consult your health care provider.  Hepatitis A vaccine.** / Consult your health care provider.  Hepatitis B vaccine.** / Consult your health care provider.  Haemophilus influenzae type b (Hib) vaccine.** / Consult your health care provider. ** Family history and personal history of risk and conditions may change your health care provider's recommendations. Document Released: 02/11/2002 Document Revised: 05/02/2014 Document Reviewed: 05/13/2011 [facility] Patient Information 2015 [place], [place]. This information is not intended to replace advice given to you by your health care provider. Make sure you discuss any questions you have with your health care provider.

## 2015-07-15 LAB — ABO AND RH: Rh Type: POSITIVE

## 2015-07-17 LAB — CYTOLOGY - PAP

## 2015-08-14 ENCOUNTER — Ambulatory Visit (INDEPENDENT_AMBULATORY_CARE_PROVIDER_SITE_OTHER): Payer: BLUE CROSS/BLUE SHIELD | Admitting: Family Medicine

## 2015-08-14 ENCOUNTER — Encounter: Payer: Self-pay | Admitting: Family Medicine

## 2015-08-14 ENCOUNTER — Ambulatory Visit: Payer: BLUE CROSS/BLUE SHIELD | Admitting: Family Medicine

## 2015-08-14 VITALS — BP 152/106 | HR 73 | Temp 98.7°F | Wt 162.8 lb

## 2015-08-14 DIAGNOSIS — I10 Essential (primary) hypertension: Secondary | ICD-10-CM

## 2015-08-14 DIAGNOSIS — N951 Menopausal and female climacteric states: Secondary | ICD-10-CM | POA: Diagnosis not present

## 2015-08-14 MED ORDER — CLIMARA 0.075 MG/24HR TD PTWK: MEDICATED_PATCH | TRANSDERMAL | Status: DC

## 2015-08-14 MED ORDER — LOSARTAN POTASSIUM-HCTZ 50-12.5 MG PO TABS: 1.0000 | ORAL_TABLET | Freq: Every day | ORAL | Status: DC

## 2015-08-14 NOTE — Progress Notes (Signed)
Patient ID: Joann Townsend, female    DOB: 06/10/1956  Age: 59 y.o. MRN: [id number]    Subjective:  Subjective HPI Joann Townsend presents for f/u bp.  No cp, sob or palpitations.   No headaches.   bp has been running high and she has been reluctant to start meds but understands she needs to now.    Review of Systems  Constitutional: Negative for diaphoresis, appetite change, fatigue and unexpected weight change.  Eyes: Negative for pain, redness and visual disturbance.  Respiratory: Negative for cough, chest tightness, shortness of breath and wheezing.   Cardiovascular: Negative for chest pain, palpitations and leg swelling.  Endocrine: Negative for cold intolerance, heat intolerance, polydipsia, polyphagia and polyuria.  Genitourinary: Negative for dysuria, frequency and difficulty urinating.  Neurological: Negative for dizziness, light-headedness, numbness and headaches.    History Past Medical History  Diagnosis Date  . Anal fissure   . Hyperlipidemia   . Internal hemorrhoids   . Colon polyp 2004    hyperplastic    She has past surgical history that includes Abdominal hysterectomy; Colonoscopy w/ polypectomy; Anal fissure repair; and Cesarean section.   Her family history includes Alcohol abuse in her father; Diabetes in her brother and mother; Heart disease in her father; Hypertension in her mother.She reports that she has never smoked. She has never used smokeless tobacco. She reports that she does not drink alcohol or use illicit drugs.  Current Outpatient Prescriptions on File Prior to Visit  Medication Sig Dispense Refill  . rosuvastatin (CRESTOR) 20 MG tablet Take 1 tablet (20 mg total) by mouth daily. 30 tablet 5   No current facility-administered medications on file prior to visit.     Objective:  Objective Physical Exam  Constitutional: She is oriented to person, place, and time. She appears well-developed and well-nourished.  HENT:  Head: Normocephalic  and atraumatic.  Eyes: Conjunctivae and EOM are normal.  Neck: Normal range of motion. Neck supple. No JVD present. Carotid bruit is not present. No thyromegaly present.  Cardiovascular: Normal rate, regular rhythm and normal heart sounds.   No murmur heard. Pulmonary/Chest: Effort normal and breath sounds normal. No respiratory distress. She has no wheezes. She has no rales. She exhibits no tenderness.  Musculoskeletal: She exhibits no edema.  Neurological: She is alert and oriented to person, place, and time.  Psychiatric: She has a normal mood and affect. Her behavior is normal.  Nursing note and vitals reviewed.  BP 152/106 mmHg  Pulse 73  Temp(Src) 98.7 F (37.1 C) (Oral)  Wt 162 lb 12.8 oz (73.846 kg)  SpO2 98% Wt Readings from Last 3 Encounters:  08/14/15 162 lb 12.8 oz (73.846 kg)  07/14/15 169 lb 3.2 oz (76.749 kg)  06/10/14 164 lb 12.8 oz (74.753 kg)     Lab Results  Component Value Date   WBC 4.0 07/14/2015   HGB 13.4 07/14/2015   HCT 41.2 07/14/2015   PLT 192.0 07/14/2015   GLUCOSE 101* 07/14/2015   CHOL 149 07/14/2015   TRIG 109.0 07/14/2015   HDL 41.20 07/14/2015   LDLCALC 86 07/14/2015   ALT 16 07/14/2015   AST 20 07/14/2015   NA 137 07/14/2015   K 3.7 07/14/2015   CL 102 07/14/2015   CREATININE 0.73 07/14/2015   BUN 12 07/14/2015   CO2 29 07/14/2015   TSH 1.56 07/14/2015   HGBA1C 6.4 04/03/2012   MICROALBUR <0.7 07/14/2015    No results found.   Assessment & Plan:  Plan I have  discontinued Ms. Both's hydrochlorothiazide. I am also having her start on losartan-hydrochlorothiazide. Additionally, I am having her maintain her rosuvastatin and CLIMARA.  Meds ordered this encounter  Medications  . losartan-hydrochlorothiazide (HYZAAR) 50-12.5 MG per tablet    Sig: Take 1 tablet by mouth daily.    Dispense:  30 tablet    Refill:  2  . CLIMARA 0.075 MG/24HR patch    Sig: PLACE ONE PATCH ONTO THE SKIN ONCE A WEEK    Dispense:  12 patch     Refill:  3    Problem List Items Addressed This Visit    HTN (hypertension) - Primary    Will start meds Low salt diet, exercise rto 2-3 weeks      Relevant Medications   losartan-hydrochlorothiazide (HYZAAR) 50-12.5 MG per tablet    Other Visit Diagnoses    Menopausal symptom        Relevant Medications    CLIMARA 0.075 MG/24HR patch       Follow-up: Return in about 3 weeks (around 09/04/2015), or if symptoms worsen or fail to improve, for hypertension.  Joann Koyanagi, DO

## 2015-08-14 NOTE — Assessment & Plan Note (Signed)
Will start meds Low salt diet, exercise rto 2-3 weeks

## 2015-08-14 NOTE — Patient Instructions (Signed)

## 2015-08-14 NOTE — Progress Notes (Signed)
Pre visit review using our clinic review tool, if applicable. No additional management support is needed unless otherwise documented below in the visit note. 

## 2015-09-05 ENCOUNTER — Encounter: Payer: Self-pay | Admitting: Family Medicine

## 2015-09-05 ENCOUNTER — Ambulatory Visit (INDEPENDENT_AMBULATORY_CARE_PROVIDER_SITE_OTHER): Payer: BLUE CROSS/BLUE SHIELD | Admitting: Family Medicine

## 2015-09-05 VITALS — BP 162/96 | HR 70 | Temp 98.2°F | Ht 61.0 in | Wt 161.0 lb

## 2015-09-05 DIAGNOSIS — R1011 Right upper quadrant pain: Secondary | ICD-10-CM

## 2015-09-05 DIAGNOSIS — I10 Essential (primary) hypertension: Secondary | ICD-10-CM

## 2015-09-05 MED ORDER — LOSARTAN POTASSIUM-HCTZ 100-25 MG PO TABS: 1.0000 | ORAL_TABLET | Freq: Every day | ORAL | Status: DC

## 2015-09-05 NOTE — Patient Instructions (Signed)

## 2015-09-05 NOTE — Progress Notes (Signed)
Pre visit review using our clinic review tool, if applicable. No additional management support is needed unless otherwise documented below in the visit note. 

## 2015-09-06 NOTE — Progress Notes (Signed)
Patient ID: Joann Townsend, female    DOB: 1956-06-25  Age: 59 y.o. MRN: [id number]    Subjective:  Subjective HPI Blaise Palladino presents for bp f/u.   No sob or cp.  She is still c/o RuQ pain the seems worse.   Comes and goes, no NVD.  No fever/ chills.   Review of Systems  Constitutional: Negative for diaphoresis, appetite change, fatigue and unexpected weight change.  Eyes: Negative for pain, redness and visual disturbance.  Respiratory: Negative for cough, chest tightness, shortness of breath and wheezing.   Cardiovascular: Negative for chest pain, palpitations and leg swelling.  Gastrointestinal: Positive for abdominal pain. Negative for nausea, vomiting, diarrhea, constipation and blood in stool.  Endocrine: Negative for cold intolerance, heat intolerance, polydipsia, polyphagia and polyuria.  Genitourinary: Negative for dysuria, frequency and difficulty urinating.  Neurological: Negative for dizziness, light-headedness, numbness and headaches.    History Past Medical History  Diagnosis Date  . Anal fissure   . Hyperlipidemia   . Internal hemorrhoids   . Colon polyp 2004    hyperplastic    She has past surgical history that includes Abdominal hysterectomy; Colonoscopy w/ polypectomy; Anal fissure repair; and Cesarean section.   Her family history includes Alcohol abuse in her father; Diabetes in her brother and mother; Heart disease in her father; Hypertension in her mother.She reports that she has never smoked. She has never used smokeless tobacco. She reports that she does not drink alcohol or use illicit drugs.  Current Outpatient Prescriptions on File Prior to Visit  Medication Sig Dispense Refill  . CLIMARA 0.075 MG/24HR patch PLACE ONE PATCH ONTO THE SKIN ONCE A WEEK 12 patch 3  . rosuvastatin (CRESTOR) 20 MG tablet Take 1 tablet (20 mg total) by mouth daily. 30 tablet 5   No current facility-administered medications on file prior to visit.     Objective:    Objective Physical Exam  Constitutional: She is oriented to person, place, and time. She appears well-developed and well-nourished.  HENT:  Head: Normocephalic and atraumatic.  Eyes: Conjunctivae and EOM are normal.  Neck: Normal range of motion. Neck supple. No JVD present. Carotid bruit is not present. No thyromegaly present.  Cardiovascular: Normal rate, regular rhythm and normal heart sounds.   No murmur heard. Pulmonary/Chest: Effort normal and breath sounds normal. No respiratory distress. She has no wheezes. She has no rales. She exhibits no tenderness.  Abdominal: She exhibits no mass. There is tenderness in the right upper quadrant. There is guarding. There is no rebound.    Musculoskeletal: She exhibits no edema.  Neurological: She is alert and oriented to person, place, and time.  Psychiatric: She has a normal mood and affect. Her behavior is normal.   BP 162/96 mmHg  Pulse 70  Temp(Src) 98.2 F (36.8 C) (Oral)  Ht 5\' 1"  (1.549 m)  Wt 161 lb (73.029 kg)  BMI 30.44 kg/m2  SpO2 97% Wt Readings from Last 3 Encounters:  09/05/15 161 lb (73.029 kg)  08/14/15 162 lb 12.8 oz (73.846 kg)  07/14/15 169 lb 3.2 oz (76.749 kg)     Lab Results  Component Value Date   WBC 4.0 07/14/2015   HGB 13.4 07/14/2015   HCT 41.2 07/14/2015   PLT 192.0 07/14/2015   GLUCOSE 101* 07/14/2015   CHOL 149 07/14/2015   TRIG 109.0 07/14/2015   HDL 41.20 07/14/2015   LDLCALC 86 07/14/2015   ALT 16 07/14/2015   AST 20 07/14/2015   NA 137 07/14/2015  K 3.7 07/14/2015   CL 102 07/14/2015   CREATININE 0.73 07/14/2015   BUN 12 07/14/2015   CO2 29 07/14/2015   TSH 1.56 07/14/2015   HGBA1C 6.4 04/03/2012   MICROALBUR <0.7 07/14/2015    No results found.   Assessment & Plan:  Plan I have discontinued Ms. Lafauci's losartan-hydrochlorothiazide. I am also having her start on losartan-hydrochlorothiazide. Additionally, I am having her maintain her rosuvastatin and CLIMARA.  Meds ordered  this encounter  Medications  . losartan-hydrochlorothiazide (HYZAAR) 100-25 MG per tablet    Sig: Take 1 tablet by mouth daily.    Dispense:  30 tablet    Refill:  2    Problem List Items Addressed This Visit    None    Visit Diagnoses    Essential hypertension    -  Primary    Relevant Medications    losartan-hydrochlorothiazide (HYZAAR) 100-25 MG per tablet    RUQ pain        Relevant Orders    US Abdomen Complete       Follow-up: Return in about 4 weeks (around 10/03/2015), or if symptoms worsen or fail to improve.  Garnet Koyanagi, DO

## 2015-09-08 ENCOUNTER — Ambulatory Visit (HOSPITAL_BASED_OUTPATIENT_CLINIC_OR_DEPARTMENT_OTHER)
Admission: RE | Admit: 2015-09-08 | Discharge: 2015-09-08 | Disposition: A | Discharge status: Home / Self Care | Payer: BLUE CROSS/BLUE SHIELD | Source: Ambulatory Visit | Attending: Family Medicine | Admitting: Family Medicine

## 2015-09-08 DIAGNOSIS — R1011 Right upper quadrant pain: Secondary | ICD-10-CM | POA: Insufficient documentation

## 2015-09-08 DIAGNOSIS — N281 Cyst of kidney, acquired: Secondary | ICD-10-CM | POA: Diagnosis not present

## 2015-09-08 DIAGNOSIS — K760 Fatty (change of) liver, not elsewhere classified: Secondary | ICD-10-CM | POA: Insufficient documentation

## 2015-10-05 ENCOUNTER — Ambulatory Visit (INDEPENDENT_AMBULATORY_CARE_PROVIDER_SITE_OTHER): Payer: BLUE CROSS/BLUE SHIELD | Admitting: Family Medicine

## 2015-10-05 ENCOUNTER — Encounter: Payer: Self-pay | Admitting: Family Medicine

## 2015-10-05 VITALS — BP 158/90 | HR 60 | Temp 98.7°F | Resp 18 | Ht 61.0 in | Wt 162.0 lb

## 2015-10-05 DIAGNOSIS — I10 Essential (primary) hypertension: Secondary | ICD-10-CM

## 2015-10-05 MED ORDER — AMLODIPINE BESYLATE 5 MG PO TABS: 5.0000 mg | ORAL_TABLET | Freq: Every day | ORAL | Status: DC

## 2015-10-05 MED ORDER — ROSUVASTATIN CALCIUM 20 MG PO TABS: 20.0000 mg | ORAL_TABLET | Freq: Every day | ORAL | Status: AC

## 2015-10-05 NOTE — Patient Instructions (Signed)
Hypertension Hypertension, commonly called high blood pressure, is when the force of blood pumping through your arteries is too strong. Your arteries are the blood vessels that carry blood from your heart throughout your body. A blood pressure reading consists of a higher number over a lower number, such as 110/72. The higher number (systolic) is the pressure inside your arteries when your heart pumps. The lower number (diastolic) is the pressure inside your arteries when your heart relaxes. Ideally you want your blood pressure below 120/80. Hypertension forces your heart to work harder to pump blood. Your arteries may become narrow or stiff. Having untreated or uncontrolled hypertension can cause heart attack, stroke, kidney disease, and other problems. RISK FACTORS Some risk factors for high blood pressure are controllable. Others are not.  Risk factors you cannot control include:   Race. You may be at higher risk if you are African American.  Age. Risk increases with age.  Gender. Men are at higher risk than women before age 45 years. After age 65, women are at higher risk than men. Risk factors you can control include:  Not getting enough exercise or physical activity.  Being overweight.  Getting too much fat, sugar, calories, or salt in your diet.  Drinking too much alcohol. SIGNS AND SYMPTOMS Hypertension does not usually cause signs or symptoms. Extremely high blood pressure (hypertensive crisis) may cause headache, anxiety, shortness of breath, and nosebleed. DIAGNOSIS To check if you have hypertension, your health care provider will measure your blood pressure while you are seated, with your arm held at the level of your heart. It should be measured at least twice using the same arm. Certain conditions can cause a difference in blood pressure between your right and left arms. A blood pressure reading that is higher than normal on one occasion does not mean that you need treatment. If  it is not clear whether you have high blood pressure, you may be asked to return on a different day to have your blood pressure checked again. Or, you may be asked to monitor your blood pressure at home for 1 or more weeks. TREATMENT Treating high blood pressure includes making lifestyle changes and possibly taking medicine. Living a healthy lifestyle can help lower high blood pressure. You may need to change some of your habits. Lifestyle changes may include:  Following the DASH diet. This diet is high in fruits, vegetables, and whole grains. It is low in salt, red meat, and added sugars.  Keep your sodium intake below 2,300 mg per day.  Getting at least 30-45 minutes of aerobic exercise at least 4 times per week.  Losing weight if necessary.  Not smoking.  Limiting alcoholic beverages.  Learning ways to reduce stress. Your health care provider may prescribe medicine if lifestyle changes are not enough to get your blood pressure under control, and if one of the following is true:  You are 18-59 years of age and your systolic blood pressure is above 140.  You are 60 years of age or older, and your systolic blood pressure is above 150.  Your diastolic blood pressure is above 90.  You have diabetes, and your systolic blood pressure is over 140 or your diastolic blood pressure is over 90.  You have kidney disease and your blood pressure is above 140/90.  You have heart disease and your blood pressure is above 140/90. Your personal target blood pressure may vary depending on your medical conditions, your age, and other factors. HOME CARE INSTRUCTIONS    Have your blood pressure rechecked as directed by your health care provider.   Take medicines only as directed by your health care provider. Follow the directions carefully. Blood pressure medicines must be taken as prescribed. The medicine does not work as well when you skip doses. Skipping doses also puts you at risk for  problems.  Do not smoke.   Monitor your blood pressure at home as directed by your health care provider. SEEK MEDICAL CARE IF:   You think you are having a reaction to medicines taken.  You have recurrent headaches or feel dizzy.  You have swelling in your ankles.  You have trouble with your vision. SEEK IMMEDIATE MEDICAL CARE IF:  You develop a severe headache or confusion.  You have unusual weakness, numbness, or feel faint.  You have severe chest or abdominal pain.  You vomit repeatedly.  You have trouble breathing. MAKE SURE YOU:   Understand these instructions.  Will watch your condition.  Will get help right away if you are not doing well or get worse.   This information is not intended to replace advice given to you by your health care provider. Make sure you discuss any questions you have with your health care provider.   Document Released: 12/16/2005 Document Revised: 05/02/2015 Document Reviewed: 10/08/2013 Elsevier Interactive Patient Education 2016 [employer]  

## 2015-10-05 NOTE — Progress Notes (Signed)
Patient ID: Joann Townsend, female   DOB: [birth year], 59 y.o.   MRN: [id number]   Subjective:    Patient ID: Joann Townsend, female    DOB: September 30, 1956, 59 y.o.   MRN: [id number]  Chief Complaint  Patient presents with  . Follow-up    HPI Patient is in today for f/u bp.  No chest pain or palpitations.  No sob.    She is also f/u with cholesterol.    Past Medical History  Diagnosis Date  . Anal fissure   . Hyperlipidemia   . Internal hemorrhoids   . Colon polyp 2004    hyperplastic    Past Surgical History  Procedure Laterality Date  . Abdominal hysterectomy    . Colonoscopy w/ polypectomy      05/17/03  . Anal fissure repair    . Cesarean section      x 3    Family History  Problem Relation Age of Onset  . Hypertension Mother   . Diabetes Mother   . Diabetes Brother   . Heart disease Father   . Alcohol abuse Father     died from    Social History   Social History  . Marital Status: Married    Spouse Name: N/A  . Number of Children: 3  . Years of Education: N/A   Occupational History  . [employer]    Social History Main Topics  . Smoking status: Never Smoker   . Smokeless tobacco: Never Used  . Alcohol Use: No  . Drug Use: No  . Sexual Activity:    Partners: Male   Other Topics Concern  . Not on file   Social History Narrative   Exercise-- occasional    Outpatient Prescriptions Prior to Visit  Medication Sig Dispense Refill  . CLIMARA 0.075 MG/24HR patch PLACE ONE PATCH ONTO THE SKIN ONCE A WEEK 12 patch 3  . losartan-hydrochlorothiazide (HYZAAR) 100-25 MG per tablet Take 1 tablet by mouth daily. 30 tablet 2  . rosuvastatin (CRESTOR) 20 MG tablet Take 1 tablet (20 mg total) by mouth daily. 30 tablet 5   No facility-administered medications prior to visit.    No Known Allergies  Review of Systems  Constitutional: Negative for fever and malaise/fatigue.  HENT: Negative for congestion.   Eyes: Negative for discharge.    Respiratory: Negative for shortness of breath.   Cardiovascular: Negative for chest pain, palpitations and leg swelling.  Gastrointestinal: Negative for nausea and abdominal pain.  Genitourinary: Negative for dysuria.  Musculoskeletal: Negative for falls.  Skin: Negative for rash.  Neurological: Negative for loss of consciousness and headaches.  Endo/Heme/Allergies: Negative for environmental allergies.  Psychiatric/Behavioral: Negative for depression. The patient is not nervous/anxious.        Objective:    Physical Exam  Constitutional: She is oriented to person, place, and time. She appears well-developed and well-nourished.  HENT:  Head: Normocephalic and atraumatic.  Eyes: Conjunctivae and EOM are normal.  Neck: Normal range of motion. Neck supple. No JVD present. Carotid bruit is not present. No thyromegaly present.  Cardiovascular: Normal rate, regular rhythm and normal heart sounds.   No murmur heard. Pulmonary/Chest: Effort normal and breath sounds normal. No respiratory distress. She has no wheezes. She has no rales. She exhibits no tenderness.  Musculoskeletal: She exhibits no edema.  Neurological: She is alert and oriented to person, place, and time.  Psychiatric: She has a normal mood and affect. Her behavior is normal.    BP 158/90  mmHg  Pulse 60  Temp(Src) 98.7 F (37.1 C) (Oral)  Resp 18  Ht 5\' 1"  (1.549 m)  Wt 162 lb (73.483 kg)  BMI 30.63 kg/m2  SpO2 99% Wt Readings from Last 3 Encounters:  10/05/15 162 lb (73.483 kg)  09/05/15 161 lb (73.029 kg)  08/14/15 162 lb 12.8 oz (73.846 kg)     Lab Results  Component Value Date   WBC 4.0 07/14/2015   HGB 13.4 07/14/2015   HCT 41.2 07/14/2015   PLT 192.0 07/14/2015   GLUCOSE 101* 07/14/2015   CHOL 149 07/14/2015   TRIG 109.0 07/14/2015   HDL 41.20 07/14/2015   LDLCALC 86 07/14/2015   ALT 16 07/14/2015   AST 20 07/14/2015   NA 137 07/14/2015   K 3.7 07/14/2015   CL 102 07/14/2015   CREATININE 0.73  07/14/2015   BUN 12 07/14/2015   CO2 29 07/14/2015   TSH 1.56 07/14/2015   HGBA1C 6.4 04/03/2012   MICROALBUR <0.7 07/14/2015    Lab Results  Component Value Date   TSH 1.56 07/14/2015   Lab Results  Component Value Date   WBC 4.0 07/14/2015   HGB 13.4 07/14/2015   HCT 41.2 07/14/2015   MCV 84.4 07/14/2015   PLT 192.0 07/14/2015   Lab Results  Component Value Date   NA 137 07/14/2015   K 3.7 07/14/2015   CO2 29 07/14/2015   GLUCOSE 101* 07/14/2015   BUN 12 07/14/2015   CREATININE 0.73 07/14/2015   BILITOT 0.7 07/14/2015   ALKPHOS 68 07/14/2015   AST 20 07/14/2015   ALT 16 07/14/2015   PROT 7.6 07/14/2015   ALBUMIN 4.1 07/14/2015   CALCIUM 9.1 07/14/2015   GFR 105.04 07/14/2015   Lab Results  Component Value Date   CHOL 149 07/14/2015   Lab Results  Component Value Date   HDL 41.20 07/14/2015   Lab Results  Component Value Date   LDLCALC 86 07/14/2015   Lab Results  Component Value Date   TRIG 109.0 07/14/2015   Lab Results  Component Value Date   CHOLHDL 4 07/14/2015   Lab Results  Component Value Date   HGBA1C 6.4 04/03/2012       Assessment & Plan:   Problem List Items Addressed This Visit    None    Visit Diagnoses    Essential hypertension    -  Primary    Relevant Medications    amLODipine (NORVASC) 5 MG tablet    rosuvastatin (CRESTOR) 20 MG tablet     con't losartan -- recheck 3 months  I am having Ms. Czerwinski start on amLODipine. I am also having her maintain her CLIMARA, losartan-hydrochlorothiazide, and rosuvastatin.  Meds ordered this encounter  Medications  . amLODipine (NORVASC) 5 MG tablet    Sig: Take 1 tablet (5 mg total) by mouth daily.    Dispense:  30 tablet    Refill:  5  . rosuvastatin (CRESTOR) 20 MG tablet    Sig: Take 1 tablet (20 mg total) by mouth daily.    Dispense:  30 tablet    Refill:  [place], DO

## 2015-10-05 NOTE — Progress Notes (Signed)
Pre visit review using our clinic review tool, if applicable. No additional management support is needed unless otherwise documented below in the visit note. 

## 2015-12-05 ENCOUNTER — Other Ambulatory Visit: Payer: Self-pay | Admitting: Family Medicine

## 2016-01-05 ENCOUNTER — Ambulatory Visit: Payer: BLUE CROSS/BLUE SHIELD | Admitting: Family Medicine

## 2016-01-19 ENCOUNTER — Encounter: Payer: Self-pay | Admitting: Family Medicine

## 2016-01-19 ENCOUNTER — Ambulatory Visit (INDEPENDENT_AMBULATORY_CARE_PROVIDER_SITE_OTHER): Payer: BLUE CROSS/BLUE SHIELD | Admitting: Family Medicine

## 2016-01-19 VITALS — BP 136/74 | HR 77 | Temp 97.7°F | Wt 154.6 lb

## 2016-01-19 DIAGNOSIS — I10 Essential (primary) hypertension: Secondary | ICD-10-CM

## 2016-01-19 DIAGNOSIS — E785 Hyperlipidemia, unspecified: Secondary | ICD-10-CM

## 2016-01-19 LAB — COMPREHENSIVE METABOLIC PANEL
ALT: 11 U/L (ref 6–29)
AST: 16 U/L (ref 10–35)
Albumin: 4.2 g/dL (ref 3.6–5.1)
Alkaline Phosphatase: 60 U/L (ref 33–130)
BUN: 18 mg/dL (ref 7–25)
CHLORIDE: 101 mmol/L (ref 98–110)
CO2: 27 mmol/L (ref 20–31)
CREATININE: 0.72 mg/dL (ref 0.50–1.05)
Calcium: 9 mg/dL (ref 8.6–10.4)
Glucose, Bld: 81 mg/dL (ref 65–99)
Potassium: 3.5 mmol/L (ref 3.5–5.3)
SODIUM: 137 mmol/L (ref 135–146)
Total Bilirubin: 0.5 mg/dL (ref 0.2–1.2)
Total Protein: 7.4 g/dL (ref 6.1–8.1)

## 2016-01-19 LAB — LIPID PANEL
CHOL/HDL RATIO: 3.1 ratio (ref <=5.0)
Cholesterol: 124 mg/dL — ABNORMAL LOW (ref 125–200)
HDL: 40 mg/dL — AB (ref >=46)
LDL CALC: 60 mg/dL (ref <130)
Triglycerides: 122 mg/dL (ref <150)
VLDL: 24 mg/dL (ref <30)

## 2016-01-19 NOTE — Progress Notes (Signed)
Pre visit review using our clinic review tool, if applicable. No additional management support is needed unless otherwise documented below in the visit note. 

## 2016-01-19 NOTE — Assessment & Plan Note (Signed)
con't losartan hct con't norvasc rto 3 months or sooner prn Check labs

## 2016-01-19 NOTE — Assessment & Plan Note (Signed)
Cont crestor  Check labs

## 2016-01-19 NOTE — Patient Instructions (Signed)
Hypertension Hypertension, commonly called high blood pressure, is when the force of blood pumping through your arteries is too strong. Your arteries are the blood vessels that carry blood from your heart throughout your body. A blood pressure reading consists of a higher number over a lower number, such as 110/72. The higher number (systolic) is the pressure inside your arteries when your heart pumps. The lower number (diastolic) is the pressure inside your arteries when your heart relaxes. Ideally you want your blood pressure below 120/80. Hypertension forces your heart to work harder to pump blood. Your arteries may become narrow or stiff. Having untreated or uncontrolled hypertension can cause heart attack, stroke, kidney disease, and other problems. RISK FACTORS Some risk factors for high blood pressure are controllable. Others are not.  Risk factors you cannot control include:   Race. You may be at higher risk if you are African American.  Age. Risk increases with age.  Gender. Men are at higher risk than women before age 45 years. After age 65, women are at higher risk than men. Risk factors you can control include:  Not getting enough exercise or physical activity.  Being overweight.  Getting too much fat, sugar, calories, or salt in your diet.  Drinking too much alcohol. SIGNS AND SYMPTOMS Hypertension does not usually cause signs or symptoms. Extremely high blood pressure (hypertensive crisis) may cause headache, anxiety, shortness of breath, and nosebleed. DIAGNOSIS To check if you have hypertension, your health care provider will measure your blood pressure while you are seated, with your arm held at the level of your heart. It should be measured at least twice using the same arm. Certain conditions can cause a difference in blood pressure between your right and left arms. A blood pressure reading that is higher than normal on one occasion does not mean that you need treatment. If  it is not clear whether you have high blood pressure, you may be asked to return on a different day to have your blood pressure checked again. Or, you may be asked to monitor your blood pressure at home for 1 or more weeks. TREATMENT Treating high blood pressure includes making lifestyle changes and possibly taking medicine. Living a healthy lifestyle can help lower high blood pressure. You may need to change some of your habits. Lifestyle changes may include:  Following the DASH diet. This diet is high in fruits, vegetables, and whole grains. It is low in salt, red meat, and added sugars.  Keep your sodium intake below 2,300 mg per day.  Getting at least 30-45 minutes of aerobic exercise at least 4 times per week.  Losing weight if necessary.  Not smoking.  Limiting alcoholic beverages.  Learning ways to reduce stress. Your health care provider may prescribe medicine if lifestyle changes are not enough to get your blood pressure under control, and if one of the following is true:  You are 18-59 years of age and your systolic blood pressure is above 140.  You are 60 years of age or older, and your systolic blood pressure is above 150.  Your diastolic blood pressure is above 90.  You have diabetes, and your systolic blood pressure is over 140 or your diastolic blood pressure is over 90.  You have kidney disease and your blood pressure is above 140/90.  You have heart disease and your blood pressure is above 140/90. Your personal target blood pressure may vary depending on your medical conditions, your age, and other factors. HOME CARE INSTRUCTIONS    Have your blood pressure rechecked as directed by your health care provider.   Take medicines only as directed by your health care provider. Follow the directions carefully. Blood pressure medicines must be taken as prescribed. The medicine does not work as well when you skip doses. Skipping doses also puts you at risk for  problems.  Do not smoke.   Monitor your blood pressure at home as directed by your health care provider. SEEK MEDICAL CARE IF:   You think you are having a reaction to medicines taken.  You have recurrent headaches or feel dizzy.  You have swelling in your ankles.  You have trouble with your vision. SEEK IMMEDIATE MEDICAL CARE IF:  You develop a severe headache or confusion.  You have unusual weakness, numbness, or feel faint.  You have severe chest or abdominal pain.  You vomit repeatedly.  You have trouble breathing. MAKE SURE YOU:   Understand these instructions.  Will watch your condition.  Will get help right away if you are not doing well or get worse.   This information is not intended to replace advice given to you by your health care provider. Make sure you discuss any questions you have with your health care provider.   Document Released: 12/16/2005 Document Revised: 05/02/2015 Document Reviewed: 10/08/2013 Elsevier Interactive Patient Education 2016 [employer]  

## 2016-01-19 NOTE — Progress Notes (Signed)
Patient ID: Joann Townsend, female    DOB: 1956-09-28  Age: 60 y.o. MRN: [id number]    Subjective:  Subjective HPI Joann Townsend presents for f/u bp and cholesterol.   No complaints.    Review of Systems  Constitutional: Negative for diaphoresis, appetite change, fatigue and unexpected weight change.  Eyes: Negative for pain, redness and visual disturbance.  Respiratory: Negative for cough, chest tightness, shortness of breath and wheezing.   Cardiovascular: Negative for chest pain, palpitations and leg swelling.  Endocrine: Negative for cold intolerance, heat intolerance, polydipsia, polyphagia and polyuria.  Genitourinary: Negative for dysuria, frequency and difficulty urinating.  Neurological: Negative for dizziness, light-headedness, numbness and headaches.    History Past Medical History  Diagnosis Date  . Anal fissure   . Hyperlipidemia   . Internal hemorrhoids   . Colon polyp 2004    hyperplastic    She has past surgical history that includes Abdominal hysterectomy; Colonoscopy w/ polypectomy; Anal fissure repair; and Cesarean section.   Her family history includes Alcohol abuse in her father; Diabetes in her brother and mother; Heart disease in her father; Hypertension in her mother.She reports that she has never smoked. She has never used smokeless tobacco. She reports that she does not drink alcohol or use illicit drugs.  Current Outpatient Prescriptions on File Prior to Visit  Medication Sig Dispense Refill  . amLODipine (NORVASC) 5 MG tablet Take 1 tablet (5 mg total) by mouth daily. 30 tablet 5  . CLIMARA 0.075 MG/24HR patch PLACE ONE PATCH ONTO THE SKIN ONCE A WEEK 12 patch 3  . losartan-hydrochlorothiazide (HYZAAR) 100-25 MG tablet Take 1 tablet by mouth daily. 30 tablet 3  . rosuvastatin (CRESTOR) 20 MG tablet Take 1 tablet (20 mg total) by mouth daily. 30 tablet 5   No current facility-administered medications on file prior to visit.     Objective:    Objective Physical Exam  Constitutional: She is oriented to person, place, and time. She appears well-developed and well-nourished.  HENT:  Head: Normocephalic and atraumatic.  Eyes: Conjunctivae and EOM are normal.  Neck: Normal range of motion. Neck supple. No JVD present. Carotid bruit is not present. No thyromegaly present.  Cardiovascular: Normal rate, regular rhythm and normal heart sounds.   No murmur heard. Pulmonary/Chest: Effort normal and breath sounds normal. No respiratory distress. She has no wheezes. She has no rales. She exhibits no tenderness.  Musculoskeletal: She exhibits no edema.  Neurological: She is alert and oriented to person, place, and time.  Psychiatric: She has a normal mood and affect.  Nursing note and vitals reviewed.  BP 136/74 mmHg  Pulse 77  Temp(Src) 97.7 F (36.5 C) (Oral)  Wt 154 lb 9.6 oz (70.126 kg)  SpO2 98% Wt Readings from Last 3 Encounters:  01/19/16 154 lb 9.6 oz (70.126 kg)  10/05/15 162 lb (73.483 kg)  09/05/15 161 lb (73.029 kg)     Lab Results  Component Value Date   WBC 4.0 07/14/2015   HGB 13.4 07/14/2015   HCT 41.2 07/14/2015   PLT 192.0 07/14/2015   GLUCOSE 101* 07/14/2015   CHOL 149 07/14/2015   TRIG 109.0 07/14/2015   HDL 41.20 07/14/2015   LDLCALC 86 07/14/2015   ALT 16 07/14/2015   AST 20 07/14/2015   NA 137 07/14/2015   K 3.7 07/14/2015   CL 102 07/14/2015   CREATININE 0.73 07/14/2015   BUN 12 07/14/2015   CO2 29 07/14/2015   TSH 1.56 07/14/2015   HGBA1C 6.4  04/03/2012   MICROALBUR <0.7 07/14/2015    US Abdomen Complete  09/08/2015  CLINICAL DATA:  Intermittent right upper quadrant pain for 3 to 5 years. Initial encounter. EXAM: ULTRASOUND ABDOMEN COMPLETE COMPARISON:  None. FINDINGS: Gallbladder: No gallstones or wall thickening visualized. No sonographic Murphy sign noted. Common bile duct: Diameter: 0.4 cm Liver: No focal lesion identified. Renal echogenicity is somewhat increased consistent with fatty  infiltration. IVC: No abnormality visualized. Pancreas: Visualized portion unremarkable. Spleen: Size and appearance within normal limits. Right Kidney: Length: 9.4 cm. Echogenicity within normal limits. No hydronephrosis visualized. A 4.9 x 4.2 x 5.3 cm simple cyst is identified off the upper pole. Left Kidney: Length: 9.4 cm. Echogenicity within normal limits. No mass visualized. A 3.4 x 3.7 x 3.7 cm cyst off the lower pole is identified. Abdominal aorta: No aneurysm visualized. Other findings: None. IMPRESSION: No liver gallstones.  No acute abnormality. Fatty infiltration. Single simple bilateral renal cysts. Electronically Signed   By: Inge Rise M.D.   On: 09/08/2015 10:11     Assessment & Plan:  Plan I am having Ms. Varas maintain her CLIMARA, amLODipine, rosuvastatin, and losartan-hydrochlorothiazide.  No orders of the defined types were placed in this encounter.    Problem List Items Addressed This Visit      Unprioritized   HTN (hypertension) - Primary    con't losartan hct con't norvasc rto 3 months or sooner prn Check labs      Relevant Orders   Comp Met (CMET)   Lipid panel    Other Visit Diagnoses    Hyperlipidemia        Relevant Orders    Comp Met (CMET)    Lipid panel       Follow-up: Return in about 3 months (around 04/18/2016), or if symptoms worsen or fail to improve, for hypertension, hyperlipidemia.  Garnet Koyanagi, DO

## 2016-01-19 NOTE — Addendum Note (Signed)
Addended by: Caffie Pinto on: 01/19/2016 01:49 PM   Modules accepted: Orders

## 2016-04-18 ENCOUNTER — Ambulatory Visit: Payer: BLUE CROSS/BLUE SHIELD | Admitting: Family Medicine

## 2016-04-21 ENCOUNTER — Other Ambulatory Visit: Payer: Self-pay | Admitting: Family Medicine

## 2016-05-15 ENCOUNTER — Other Ambulatory Visit: Payer: Self-pay

## 2016-05-15 DIAGNOSIS — Z1231 Encounter for screening mammogram for malignant neoplasm of breast: Secondary | ICD-10-CM

## 2016-05-16 ENCOUNTER — Ambulatory Visit
Admission: RE | Admit: 2016-05-16 | Discharge: 2016-05-16 | Disposition: A | Discharge status: Home / Self Care | Payer: BLUE CROSS/BLUE SHIELD | Source: Ambulatory Visit

## 2016-05-16 DIAGNOSIS — Z1231 Encounter for screening mammogram for malignant neoplasm of breast: Secondary | ICD-10-CM

## 2016-05-20 ENCOUNTER — Other Ambulatory Visit: Payer: Self-pay | Admitting: Family Medicine

## 2016-05-20 DIAGNOSIS — R928 Other abnormal and inconclusive findings on diagnostic imaging of breast: Secondary | ICD-10-CM

## 2016-05-24 ENCOUNTER — Ambulatory Visit
Admission: RE | Admit: 2016-05-24 | Discharge: 2016-05-24 | Disposition: A | Discharge status: Home / Self Care | Payer: BLUE CROSS/BLUE SHIELD | Source: Ambulatory Visit | Attending: Family Medicine | Admitting: Family Medicine

## 2016-05-24 DIAGNOSIS — R928 Other abnormal and inconclusive findings on diagnostic imaging of breast: Secondary | ICD-10-CM

## 2016-08-08 ENCOUNTER — Other Ambulatory Visit: Payer: Self-pay | Admitting: Family Medicine

## 2017-07-21 ENCOUNTER — Other Ambulatory Visit: Payer: Self-pay | Admitting: Family Medicine

## 2017-07-21 DIAGNOSIS — Z1231 Encounter for screening mammogram for malignant neoplasm of breast: Secondary | ICD-10-CM

## 2017-08-01 ENCOUNTER — Ambulatory Visit
Admission: RE | Admit: 2017-08-01 | Discharge: 2017-08-01 | Disposition: A | Discharge status: Home / Self Care | Payer: BLUE CROSS/BLUE SHIELD | Source: Ambulatory Visit | Attending: Family Medicine | Admitting: Family Medicine

## 2017-08-01 DIAGNOSIS — Z1231 Encounter for screening mammogram for malignant neoplasm of breast: Secondary | ICD-10-CM

## 2017-08-05 ENCOUNTER — Other Ambulatory Visit: Payer: Self-pay | Admitting: Family Medicine

## 2017-08-05 DIAGNOSIS — R928 Other abnormal and inconclusive findings on diagnostic imaging of breast: Secondary | ICD-10-CM

## 2017-08-06 ENCOUNTER — Ambulatory Visit
Admission: RE | Admit: 2017-08-06 | Discharge: 2017-08-06 | Disposition: A | Discharge status: Home / Self Care | Payer: BLUE CROSS/BLUE SHIELD | Source: Ambulatory Visit | Attending: Family Medicine | Admitting: Family Medicine

## 2017-08-06 DIAGNOSIS — R928 Other abnormal and inconclusive findings on diagnostic imaging of breast: Secondary | ICD-10-CM

## 2017-08-08 ENCOUNTER — Other Ambulatory Visit: Payer: BLUE CROSS/BLUE SHIELD

## 2017-12-17 ENCOUNTER — Encounter: Payer: Self-pay | Admitting: Gastroenterology

## 2018-02-10 ENCOUNTER — Other Ambulatory Visit: Payer: Self-pay

## 2018-02-10 ENCOUNTER — Ambulatory Visit (AMBULATORY_SURGERY_CENTER): Payer: Self-pay | Admitting: *Deleted

## 2018-02-10 VITALS — Ht 59.0 in | Wt 160.0 lb

## 2018-02-10 DIAGNOSIS — Z86010 Personal history of colonic polyps: Secondary | ICD-10-CM

## 2018-02-10 MED ORDER — NA SULFATE-K SULFATE-MG SULF 17.5-3.13-1.6 GM/177ML PO SOLN: 1.0000 | Freq: Once | ORAL | 0 refills | Status: AC

## 2018-02-10 NOTE — Progress Notes (Signed)
No egg or soy allergy known to patient  No issues with past sedation with any surgeries  or procedures, no intubation problems  No diet pills per patient No home 02 use per patient  No blood thinners per patient  Pt denies issues with constipation  No A fib or A flutter  EMMI video sent to pt's e mail pt declined   

## 2018-02-20 ENCOUNTER — Encounter: Payer: Self-pay | Admitting: Gastroenterology

## 2018-02-20 ENCOUNTER — Other Ambulatory Visit: Payer: Self-pay

## 2018-02-20 ENCOUNTER — Ambulatory Visit (AMBULATORY_SURGERY_CENTER): Payer: BLUE CROSS/BLUE SHIELD | Admitting: Gastroenterology

## 2018-02-20 VITALS — BP 111/68 | HR 65 | Temp 97.8°F | Resp 14 | Ht 59.0 in | Wt 160.0 lb

## 2018-02-20 DIAGNOSIS — D125 Benign neoplasm of sigmoid colon: Secondary | ICD-10-CM | POA: Diagnosis not present

## 2018-02-20 DIAGNOSIS — D123 Benign neoplasm of transverse colon: Secondary | ICD-10-CM | POA: Diagnosis not present

## 2018-02-20 DIAGNOSIS — Z86010 Personal history of colonic polyps: Secondary | ICD-10-CM | POA: Diagnosis not present

## 2018-02-20 MED ORDER — SODIUM CHLORIDE 0.9 % IV SOLN: 500.0000 mL | Freq: Once | INTRAVENOUS | Status: AC

## 2018-02-20 NOTE — Patient Instructions (Signed)
YOU HAD AN ENDOSCOPIC PROCEDURE TODAY AT THE [facility]:   Refer to the procedure report that was given to you for any specific questions about what was found during the examination.  If the procedure report does not answer your questions, please call your gastroenterologist to clarify.  If you requested that your care partner not be given the details of your procedure findings, then the procedure report has been included in a sealed envelope for you to review at your convenience later.  YOU SHOULD EXPECT: Some feelings of bloating in the abdomen. Passage of more gas than usual.  Walking can help get rid of the air that was put into your GI tract during the procedure and reduce the bloating. If you had a lower endoscopy (such as a colonoscopy or flexible sigmoidoscopy) you may notice spotting of blood in your stool or on the toilet paper. If you underwent a bowel prep for your procedure, you may not have a normal bowel movement for a few days.  Please Note:  You might notice some irritation and congestion in your nose or some drainage.  This is from the oxygen used during your procedure.  There is no need for concern and it should clear up in a day or so.  SYMPTOMS TO REPORT IMMEDIATELY:   Following lower endoscopy (colonoscopy or flexible sigmoidoscopy):  Excessive amounts of blood in the stool  Significant tenderness or worsening of abdominal pains  Swelling of the abdomen that is new, acute  Fever of 100F or higher   For urgent or emergent issues, a gastroenterologist can be reached at any hour by calling [phone].   DIET:  We do recommend a small meal at first, but then you may proceed to your regular diet.  Drink plenty of fluids but you should avoid alcoholic beverages for 24 hours. Try to increase the fiber in your diet, and drink plenty of water.  ACTIVITY:  You should plan to take it easy for the rest of today and you should NOT DRIVE or use heavy machinery until  tomorrow (because of the sedation medicines used during the test).    FOLLOW UP: Our staff will call the number listed on your records the next business day following your procedure to check on you and address any questions or concerns that you may have regarding the information given to you following your procedure. If we do not reach you, we will leave a message.  However, if you are feeling well and you are not experiencing any problems, there is no need to return our call.  We will assume that you have returned to your regular daily activities without incident.  If any biopsies were taken you will be contacted by phone or by letter within the next 1-3 weeks.  Please call us at [phone] if you have not heard about the biopsies in 3 weeks.    SIGNATURES/CONFIDENTIALITY: You and/or your care partner have signed paperwork which will be entered into your electronic medical record.  These signatures attest to the fact that that the information above on your After Visit Summary has been reviewed and is understood.  Full responsibility of the confidentiality of this discharge information lies with you and/or your care-partner.  Read all of the handouts given to you by your recovery room nurse. 

## 2018-02-20 NOTE — Op Note (Signed)
[name] Patient Name: Joann Townsend Procedure Date: 02/20/2018 9:30 AM MRN: [id number] Endoscopist: Ladene Artist , MD Age: 62 Referring MD:  Date of Birth: 05-Nov-1956 Gender: Female Account #: [id number] Procedure:                Colonoscopy Indications:              Surveillance: Personal history of adenomatous                            polyps on last colonoscopy 5 years ago Medicines:                Monitored Anesthesia Care Procedure:                Pre-Anesthesia Assessment:                           - Prior to the procedure, a History and Physical                            was performed, and patient medications and                            allergies were reviewed. The patient's tolerance of                            previous anesthesia was also reviewed. The risks                            and benefits of the procedure and the sedation                            options and risks were discussed with the patient.                            All questions were answered, and informed consent                            was obtained. Prior Anticoagulants: The patient has                            taken no previous anticoagulant or antiplatelet                            agents. ASA Grade Assessment: II - A patient with                            mild systemic disease. After reviewing the risks                            and benefits, the patient was deemed in                            satisfactory condition to undergo the procedure.  After obtaining informed consent, the colonoscope                            was passed under direct vision. Throughout the                            procedure, the patient's blood pressure, pulse, and                            oxygen saturations were monitored continuously. The                            Colonoscope was introduced through the anus and                            advanced to the the  cecum, identified by                            appendiceal orifice and ileocecal valve. The                            ileocecal valve, appendiceal orifice, and rectum                            were photographed. The quality of the bowel                            preparation was excellent. The colonoscopy was                            performed without difficulty. The patient tolerated                            the procedure well. Scope In: 9:34:44 AM Scope Out: 9:50:48 AM Scope Withdrawal Time: 0 hours 12 minutes 35 seconds  Total Procedure Duration: 0 hours 16 minutes 4 seconds  Findings:                 The perianal and digital rectal examinations were                            normal.                           Two sessile polyps were found in the sigmoid colon                            and transverse colon. The polyps were 6 to 8 mm in                            size. These polyps were removed with a cold snare.                            Resection and retrieval were complete.  A 4 mm polyp was found in the transverse colon. The                            polyp was sessile. The polyp was removed with a                            cold biopsy forceps. Resection and retrieval were                            complete.                           Scattered small-mouthed diverticula were found in                            the transverse colon and ascending colon. There was                            no evidence of diverticular bleeding.                           Internal hemorrhoids were found during                            retroflexion. The hemorrhoids were small and Grade                            I (internal hemorrhoids that do not prolapse).                           The exam was otherwise without abnormality on                            direct and retroflexion views. Complications:            No immediate complications. Estimated blood loss:                             None. Estimated Blood Loss:     Estimated blood loss: none. Impression:               - Two 6 to 8 mm polyps in the sigmoid colon and in                            the transverse colon, removed with a cold snare.                            Resected and retrieved.                           - One 4 mm polyp in the transverse colon, removed                            with a cold biopsy forceps. Resected and retrieved.                           -  Mild diverticulosis in the transverse colon and                            in the ascending colon. There was no evidence of                            diverticular bleeding.                           - Internal hemorrhoids.                           - The examination was otherwise normal on direct                            and retroflexion views. Recommendation:           - Repeat colonoscopy in 5 years for surveillance.                           - Patient has a contact number available for                            emergencies. The signs and symptoms of potential                            delayed complications were discussed with the                            patient. Return to normal activities tomorrow.                            Written discharge instructions were provided to the                            patient.                           - Resume previous diet.                           - Continue present medications.                           - Await pathology results. Ladene Artist, MD 02/20/2018 9:57:18 AM This report has been signed electronically.

## 2018-02-20 NOTE — Progress Notes (Signed)
A and O x3. Report to RN. Tolerated MAC anesthesia well.

## 2018-02-23 ENCOUNTER — Telehealth: Payer: Self-pay

## 2018-02-23 NOTE — Telephone Encounter (Signed)
  Follow up Call-  Call back number 02/20/2018  Post procedure Call Back phone  # [phone]  Permission to leave phone message Yes  Some recent data might be hidden     Patient questions:  Do you have a fever, pain , or abdominal swelling? No. Pain Score  0 *  Have you tolerated food without any problems? Yes.    Have you been able to return to your normal activities? Yes.    Do you have any questions about your discharge instructions: Diet   No. Medications  No. Follow up visit  No.  Do you have questions or concerns about your Care? No.  Actions: * If pain score is 4 or above: No action needed, pain <4.  No problems noted per pt. maw

## 2018-02-28 ENCOUNTER — Encounter: Payer: Self-pay | Admitting: Gastroenterology

## 2019-01-25 ENCOUNTER — Other Ambulatory Visit: Payer: Self-pay | Admitting: Family Medicine

## 2019-01-25 DIAGNOSIS — Z1231 Encounter for screening mammogram for malignant neoplasm of breast: Secondary | ICD-10-CM

## 2020-04-05 ENCOUNTER — Other Ambulatory Visit: Payer: Self-pay | Admitting: Family Medicine

## 2020-04-05 DIAGNOSIS — Z1231 Encounter for screening mammogram for malignant neoplasm of breast: Secondary | ICD-10-CM

## 2020-04-21 ENCOUNTER — Other Ambulatory Visit: Payer: Self-pay | Admitting: Family Medicine

## 2020-04-21 DIAGNOSIS — R109 Unspecified abdominal pain: Secondary | ICD-10-CM

## 2020-05-09 ENCOUNTER — Ambulatory Visit
Admission: RE | Admit: 2020-05-09 | Discharge: 2020-05-09 | Disposition: A | Discharge status: Home / Self Care | Source: Ambulatory Visit | Attending: Family Medicine | Admitting: Family Medicine

## 2020-05-09 DIAGNOSIS — R109 Unspecified abdominal pain: Secondary | ICD-10-CM

## 2020-05-09 MED ORDER — IOPAMIDOL (ISOVUE-300) INJECTION 61%
100.0000 mL | Freq: Once | INTRAVENOUS | Status: AC | PRN
  Administered 2020-05-09: 100 mL via INTRAVENOUS

## 2020-05-17 ENCOUNTER — Other Ambulatory Visit: Payer: Self-pay

## 2020-05-17 ENCOUNTER — Ambulatory Visit
Admission: RE | Admit: 2020-05-17 | Discharge: 2020-05-17 | Disposition: A | Discharge status: Home / Self Care | Source: Ambulatory Visit | Attending: Family Medicine | Admitting: Family Medicine

## 2020-05-17 DIAGNOSIS — Z1231 Encounter for screening mammogram for malignant neoplasm of breast: Secondary | ICD-10-CM

## 2020-12-12 ENCOUNTER — Other Ambulatory Visit: Payer: Self-pay | Admitting: Family Medicine

## 2020-12-12 ENCOUNTER — Ambulatory Visit
Admission: RE | Admit: 2020-12-12 | Discharge: 2020-12-12 | Disposition: A | Discharge status: Home / Self Care | Source: Ambulatory Visit | Attending: Family Medicine | Admitting: Family Medicine

## 2020-12-12 DIAGNOSIS — R7611 Nonspecific reaction to tuberculin skin test without active tuberculosis: Secondary | ICD-10-CM

## 2021-02-22 ENCOUNTER — Other Ambulatory Visit: Payer: Self-pay | Admitting: Family Medicine

## 2021-02-22 DIAGNOSIS — Z1231 Encounter for screening mammogram for malignant neoplasm of breast: Secondary | ICD-10-CM

## 2021-05-14 ENCOUNTER — Ambulatory Visit

## 2021-05-24 ENCOUNTER — Other Ambulatory Visit: Payer: Self-pay

## 2021-05-24 ENCOUNTER — Ambulatory Visit: Admission: EM | Admit: 2021-05-24 | Discharge: 2021-05-24 | Discharge status: Against Medical Advice

## 2021-06-27 ENCOUNTER — Other Ambulatory Visit: Payer: Self-pay

## 2021-06-27 ENCOUNTER — Ambulatory Visit
Admission: RE | Admit: 2021-06-27 | Discharge: 2021-06-27 | Disposition: A | Discharge status: Home / Self Care | Source: Ambulatory Visit | Attending: Family Medicine | Admitting: Family Medicine

## 2021-06-27 DIAGNOSIS — Z1231 Encounter for screening mammogram for malignant neoplasm of breast: Secondary | ICD-10-CM

## 2022-03-12 ENCOUNTER — Other Ambulatory Visit: Payer: Self-pay | Admitting: Family Medicine

## 2022-03-12 DIAGNOSIS — E2839 Other primary ovarian failure: Secondary | ICD-10-CM

## 2022-04-01 ENCOUNTER — Other Ambulatory Visit: Payer: Self-pay | Admitting: Family Medicine

## 2022-04-01 DIAGNOSIS — Z1231 Encounter for screening mammogram for malignant neoplasm of breast: Secondary | ICD-10-CM

## 2022-06-28 ENCOUNTER — Ambulatory Visit
Admission: RE | Admit: 2022-06-28 | Discharge: 2022-06-28 | Disposition: A | Discharge status: Home / Self Care | Payer: Medicare PPO | Source: Ambulatory Visit | Attending: Family Medicine | Admitting: Family Medicine

## 2022-06-28 DIAGNOSIS — Z1231 Encounter for screening mammogram for malignant neoplasm of breast: Secondary | ICD-10-CM

## 2022-07-03 DIAGNOSIS — E1165 Type 2 diabetes mellitus with hyperglycemia: Secondary | ICD-10-CM | POA: Diagnosis not present

## 2022-07-03 DIAGNOSIS — I10 Essential (primary) hypertension: Secondary | ICD-10-CM | POA: Diagnosis not present

## 2022-09-05 DIAGNOSIS — E559 Vitamin D deficiency, unspecified: Secondary | ICD-10-CM | POA: Diagnosis not present

## 2022-09-05 DIAGNOSIS — Z1159 Encounter for screening for other viral diseases: Secondary | ICD-10-CM | POA: Diagnosis not present

## 2022-09-05 DIAGNOSIS — Z7189 Other specified counseling: Secondary | ICD-10-CM | POA: Diagnosis not present

## 2022-09-05 DIAGNOSIS — Z0001 Encounter for general adult medical examination with abnormal findings: Secondary | ICD-10-CM | POA: Diagnosis not present

## 2022-09-05 DIAGNOSIS — E669 Obesity, unspecified: Secondary | ICD-10-CM | POA: Diagnosis not present

## 2022-09-05 DIAGNOSIS — E119 Type 2 diabetes mellitus without complications: Secondary | ICD-10-CM | POA: Diagnosis not present

## 2022-09-05 DIAGNOSIS — Z23 Encounter for immunization: Secondary | ICD-10-CM | POA: Diagnosis not present

## 2022-09-05 DIAGNOSIS — Z79899 Other long term (current) drug therapy: Secondary | ICD-10-CM | POA: Diagnosis not present

## 2022-09-05 DIAGNOSIS — I10 Essential (primary) hypertension: Secondary | ICD-10-CM | POA: Diagnosis not present

## 2022-09-18 DIAGNOSIS — M85852 Other specified disorders of bone density and structure, left thigh: Secondary | ICD-10-CM | POA: Diagnosis not present

## 2022-09-18 DIAGNOSIS — M85851 Other specified disorders of bone density and structure, right thigh: Secondary | ICD-10-CM | POA: Diagnosis not present

## 2022-09-18 DIAGNOSIS — Z780 Asymptomatic menopausal state: Secondary | ICD-10-CM | POA: Diagnosis not present

## 2023-03-12 ENCOUNTER — Other Ambulatory Visit: Payer: Self-pay | Admitting: Physician Assistant

## 2023-03-12 ENCOUNTER — Encounter: Payer: Self-pay | Admitting: Physician Assistant

## 2023-03-12 DIAGNOSIS — D487 Neoplasm of uncertain behavior of other specified sites: Secondary | ICD-10-CM

## 2023-03-18 ENCOUNTER — Inpatient Hospital Stay: Admission: RE | Admit: 2023-03-18 | Payer: TRICARE For Life (TFL) | Source: Ambulatory Visit

## 2023-03-25 ENCOUNTER — Ambulatory Visit
Admission: RE | Admit: 2023-03-25 | Discharge: 2023-03-25 | Disposition: A | Discharge status: Home / Self Care | Payer: Medicare PPO | Source: Ambulatory Visit | Attending: Physician Assistant | Admitting: Physician Assistant

## 2023-03-25 DIAGNOSIS — D487 Neoplasm of uncertain behavior of other specified sites: Secondary | ICD-10-CM

## 2023-04-28 ENCOUNTER — Other Ambulatory Visit: Payer: Self-pay | Admitting: Ophthalmology

## 2023-05-01 IMAGING — MG MM DIGITAL SCREENING BILAT W/ TOMO AND CAD
8 series · 9 of 24 positions shown · non-contrast
Comparison: Previous exam(s).

CLINICAL DATA: Screening.

EXAM:
DIGITAL SCREENING BILATERAL MAMMOGRAM WITH TOMOSYNTHESIS AND CAD
TECHNIQUE: Bilateral screening digital craniocaudal and mediolateral oblique
mammograms were obtained. Bilateral screening digital breast
tomosynthesis was performed. The images were evaluated with
computer-aided detection.

[L MLO synth-2D]
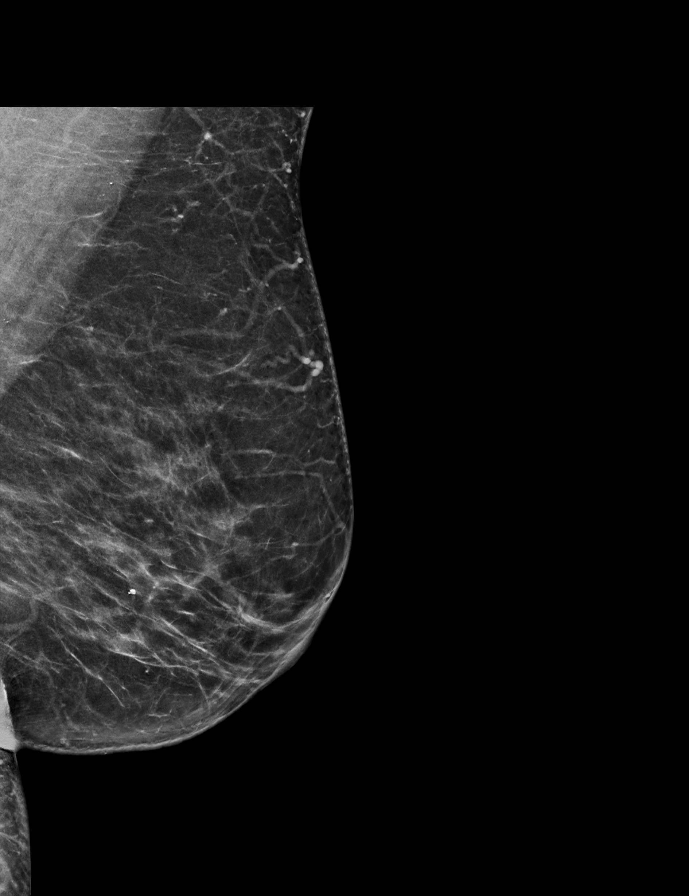

[R MLO synth-2D]
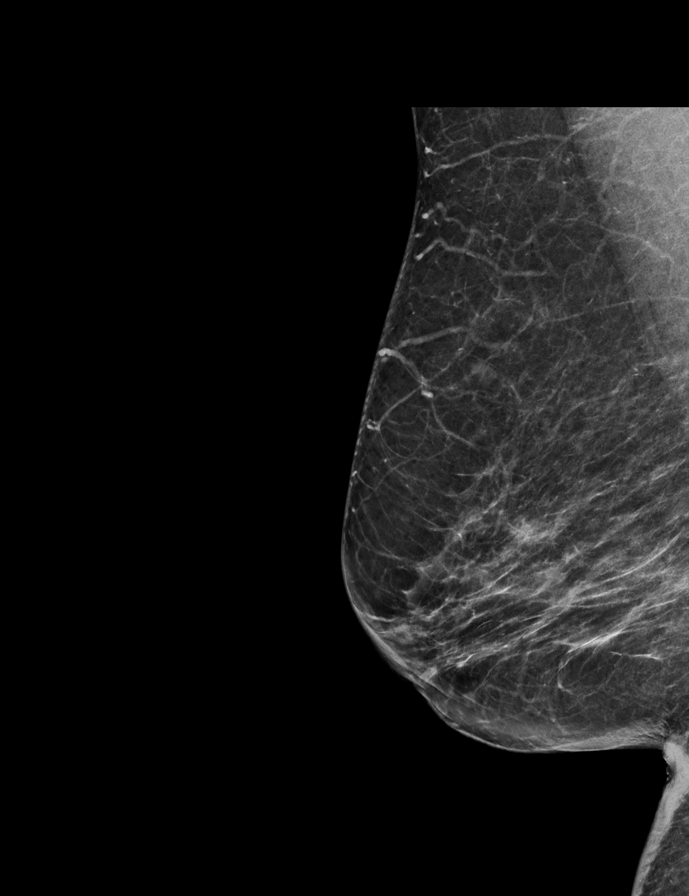

[R CC synth-2D]
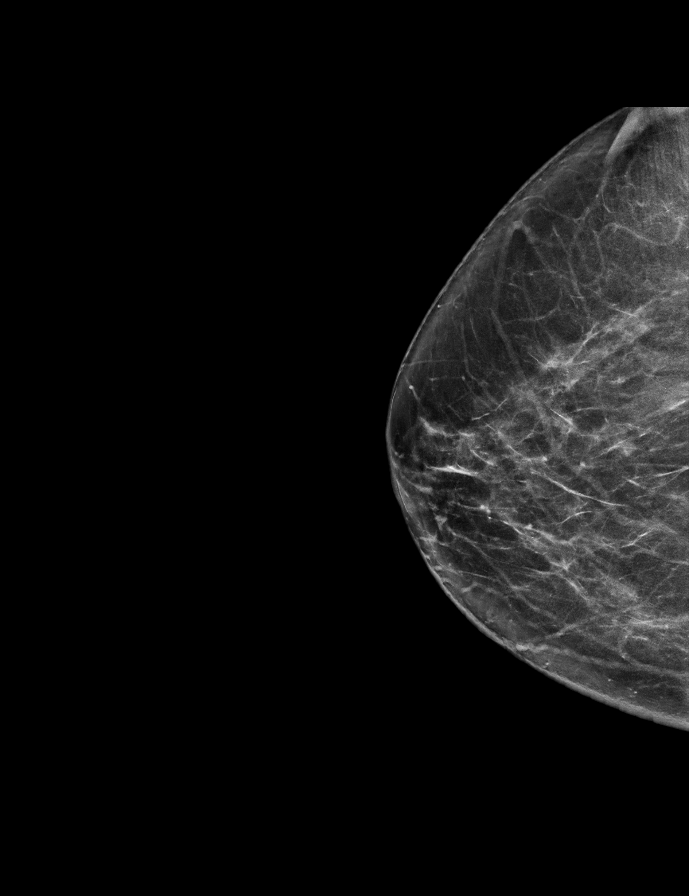

[L CC synth-2D]
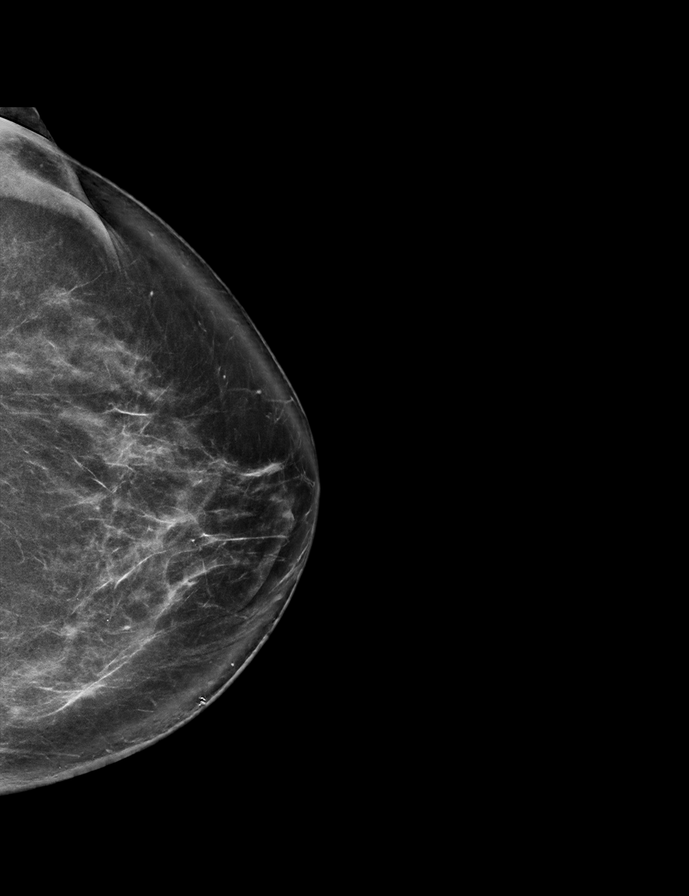

[L MLO tomo · 2 of 66 frames shown]
[frame 22/66]
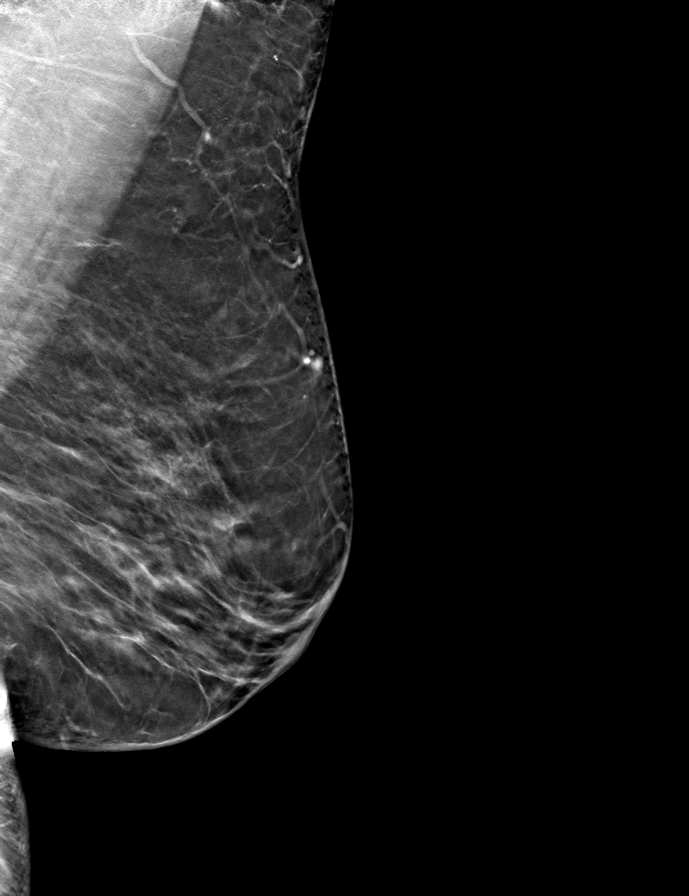
[frame 33/66]
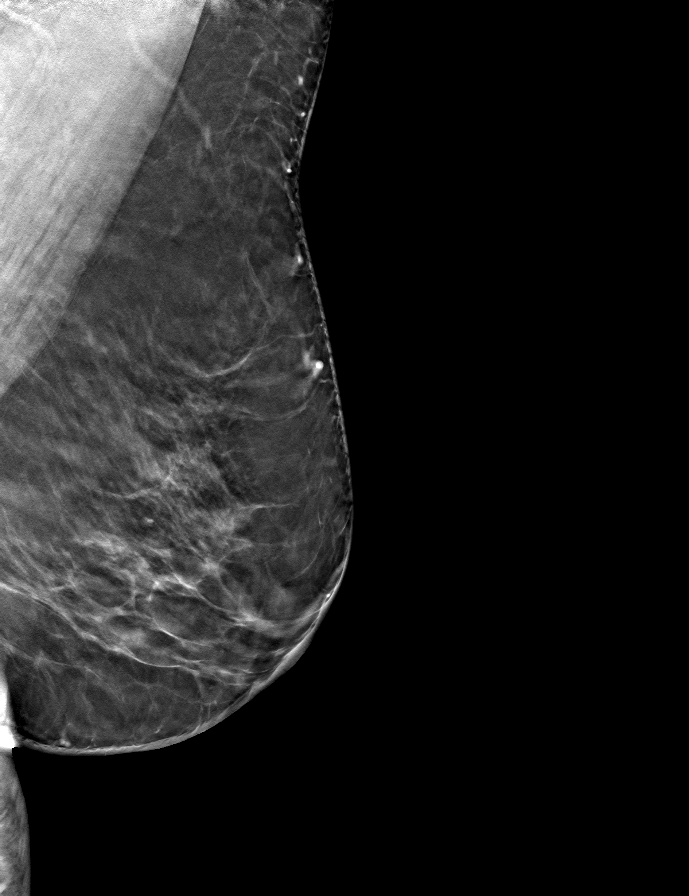

[R MLO tomo · tomo slice 31/62.0]
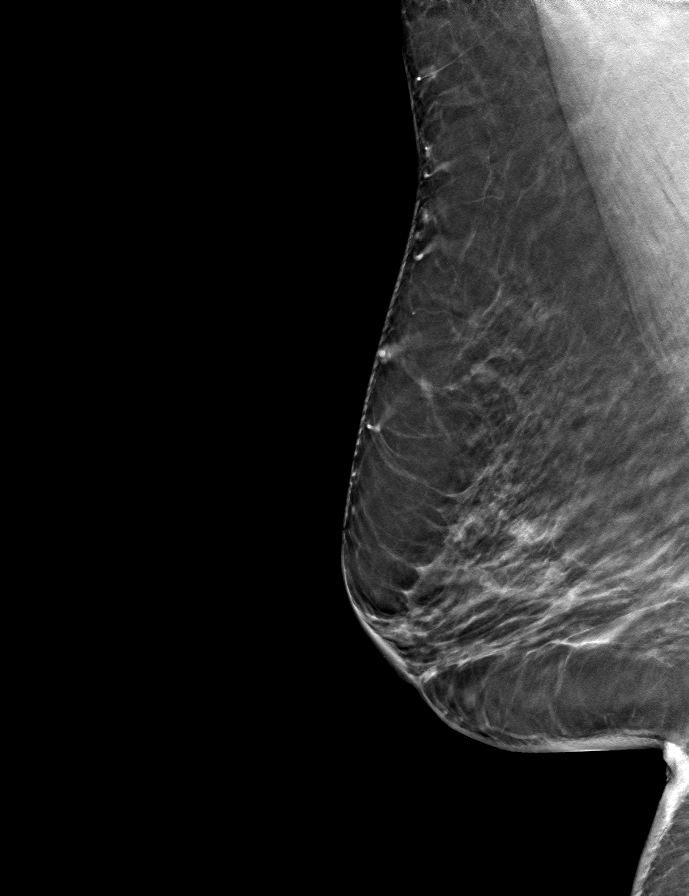

[L CC tomo · tomo slice 37/74.0]
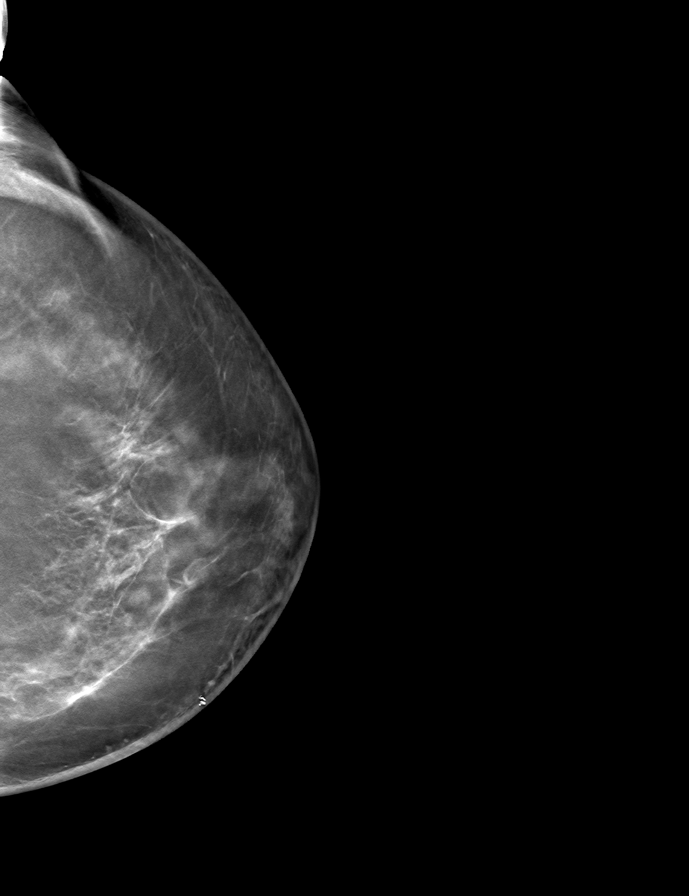

[R CC tomo · tomo slice 31/62.0]
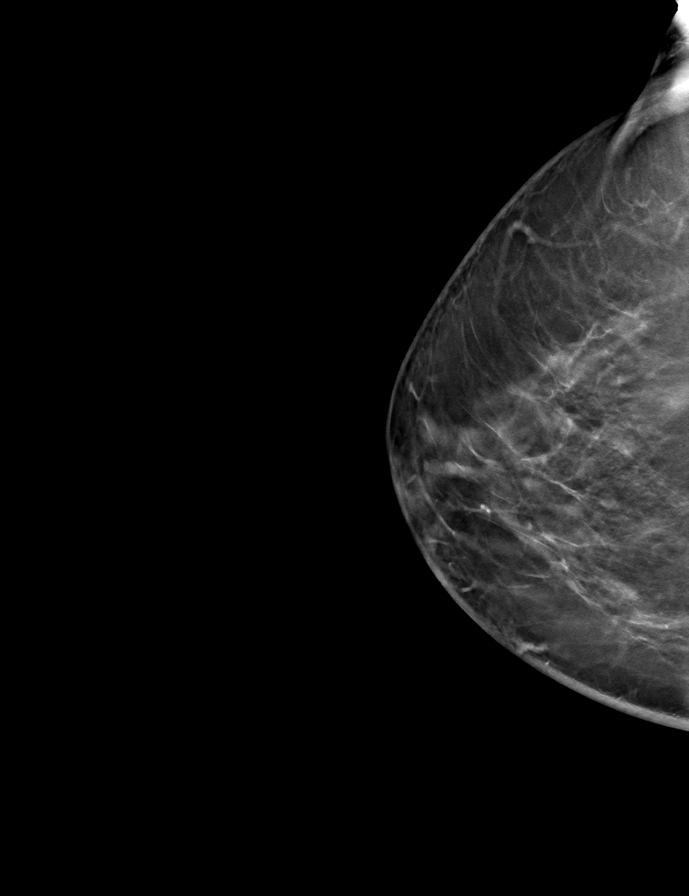

[9 of 24 positions shown; findings below may reference images not displayed]

ACR Breast Density Category b: There are scattered areas of
fibroglandular density.
FINDINGS: There are no findings suspicious for malignancy.
IMPRESSION: No mammographic evidence of malignancy. A result letter of this
screening mammogram will be mailed directly to the patient.

RECOMMENDATION:
Screening mammogram in one year. (Code:51-O-LD2)

BI-RADS CATEGORY  1: Negative.

## 2023-06-24 ENCOUNTER — Other Ambulatory Visit: Payer: Self-pay | Admitting: Nurse Practitioner

## 2023-06-24 DIAGNOSIS — Z1231 Encounter for screening mammogram for malignant neoplasm of breast: Secondary | ICD-10-CM

## 2023-07-11 ENCOUNTER — Encounter: Payer: Self-pay | Admitting: Nurse Practitioner

## 2023-08-11 ENCOUNTER — Other Ambulatory Visit: Payer: Self-pay | Admitting: Family Medicine

## 2023-08-11 ENCOUNTER — Ambulatory Visit
Admission: RE | Admit: 2023-08-11 | Discharge: 2023-08-11 | Discharge status: Home / Self Care | Payer: TRICARE For Life (TFL) | Source: Ambulatory Visit | Attending: Nurse Practitioner

## 2023-08-11 DIAGNOSIS — Z1231 Encounter for screening mammogram for malignant neoplasm of breast: Secondary | ICD-10-CM

## 2023-08-25 ENCOUNTER — Ambulatory Visit
Admission: RE | Admit: 2023-08-25 | Discharge: 2023-08-25 | Disposition: A | Discharge status: Home / Self Care | Payer: TRICARE For Life (TFL) | Source: Ambulatory Visit | Attending: Family Medicine | Admitting: Family Medicine

## 2023-08-25 ENCOUNTER — Other Ambulatory Visit: Payer: Self-pay | Admitting: Family Medicine

## 2023-08-25 DIAGNOSIS — H04819 Granuloma of unspecified lacrimal passage: Secondary | ICD-10-CM

## 2023-11-20 ENCOUNTER — Emergency Department (HOSPITAL_COMMUNITY): Payer: Medicare PPO

## 2023-11-20 ENCOUNTER — Emergency Department (HOSPITAL_COMMUNITY)
Admission: EM | Admit: 2023-11-20 | Discharge: 2023-11-20 | Disposition: A | Discharge status: Home / Self Care | Payer: Medicare PPO | Attending: Emergency Medicine | Admitting: Emergency Medicine

## 2023-11-20 ENCOUNTER — Encounter (HOSPITAL_COMMUNITY): Payer: Self-pay

## 2023-11-20 ENCOUNTER — Other Ambulatory Visit: Payer: Self-pay

## 2023-11-20 DIAGNOSIS — Z79899 Other long term (current) drug therapy: Secondary | ICD-10-CM | POA: Insufficient documentation

## 2023-11-20 DIAGNOSIS — H538 Other visual disturbances: Secondary | ICD-10-CM | POA: Insufficient documentation

## 2023-11-20 DIAGNOSIS — I10 Essential (primary) hypertension: Secondary | ICD-10-CM | POA: Diagnosis not present

## 2023-11-20 DIAGNOSIS — H539 Unspecified visual disturbance: Secondary | ICD-10-CM

## 2023-11-20 DIAGNOSIS — E876 Hypokalemia: Secondary | ICD-10-CM

## 2023-11-20 LAB — BASIC METABOLIC PANEL
Anion gap: 13 (ref 5–15)
BUN: 20 mg/dL (ref 8–23)
CO2: 24 mmol/L (ref 22–32)
Calcium: 9.7 mg/dL (ref 8.9–10.3)
Chloride: 101 mmol/L (ref 98–111)
Creatinine, Ser: 0.83 mg/dL (ref 0.44–1.00)
GFR, Estimated: >60 mL/min (ref >60)
Glucose, Bld: 108 mg/dL — ABNORMAL HIGH (ref 70–99)
Potassium: 3.2 mmol/L — ABNORMAL LOW (ref 3.5–5.1)
Sodium: 138 mmol/L (ref 135–145)

## 2023-11-20 LAB — CBC WITH DIFFERENTIAL/PLATELET
Abs Immature Granulocytes: 0.01 10*3/uL (ref 0.00–0.07)
Basophils Absolute: 0 10*3/uL (ref 0.0–0.1)
Basophils Relative: 1 %
Eosinophils Absolute: 0.1 10*3/uL (ref 0.0–0.5)
Eosinophils Relative: 1 %
HCT: 44.8 % (ref 36.0–46.0)
Hemoglobin: 14.3 g/dL (ref 12.0–15.0)
Immature Granulocytes: 0 %
Lymphocytes Relative: 37 %
Lymphs Abs: 2.3 10*3/uL (ref 0.7–4.0)
MCH: 28 pg (ref 26.0–34.0)
MCHC: 31.9 g/dL (ref 30.0–36.0)
MCV: 87.8 fL (ref 80.0–100.0)
Monocytes Absolute: 0.5 10*3/uL (ref 0.1–1.0)
Monocytes Relative: 8 %
Neutro Abs: 3.3 10*3/uL (ref 1.7–7.7)
Neutrophils Relative %: 53 %
Platelets: 193 10*3/uL (ref 150–400)
RBC: 5.1 MIL/uL (ref 3.87–5.11)
RDW: 14.6 % (ref 11.5–15.5)
WBC: 6.2 10*3/uL (ref 4.0–10.5)
nRBC: 0 % (ref 0.0–0.2)

## 2023-11-20 MED ORDER — POTASSIUM CHLORIDE CRYS ER 20 MEQ PO TBCR
40.0000 meq | EXTENDED_RELEASE_TABLET | Freq: Once | ORAL | Status: AC
  Administered 2023-11-20: 40 meq via ORAL
  Filled 2023-11-20: qty 2

## 2023-11-20 MED ORDER — IOHEXOL 350 MG/ML SOLN
75.0000 mL | Freq: Once | INTRAVENOUS | Status: AC | PRN
  Administered 2023-11-20: 75 mL via INTRAVENOUS

## 2023-11-20 NOTE — ED Provider Triage Note (Signed)
Emergency Medicine Provider Triage Evaluation Note  Joann Townsend , a 66 y.o. female  was evaluated in triage.  Pt complains of vision changes and hypertension.  Blood pressures typically 1 30-1 40 systolic.  Found to be 170 systolic today.  Presented to her primary care provider and a CT of the head was ordered at that time.  She has had blurred vision since September of this year.  Had lacrimal duct surgery.  States she stood up today and the lateral right vision was black.  This lasted a few seconds and then resolved  Review of Systems  Positive: As above Negative: As above  Physical Exam  BP (!) 170/103 (BP Location: Right Arm)   Pulse (!) 109   Temp 98.5 F (36.9 C) (Oral)   Resp 16   Ht 4\' 11"  (1.499 m)   Wt 72.6 kg   SpO2 100%   BMI 32.32 kg/m  Gen:   Awake, no distress   Resp:  Normal effort  MSK:   Moves extremities without difficulty  Other:    Medical Decision Making  Medically screening exam initiated at 5:16 PM.  Appropriate orders placed.  Aravis Sergeant was informed that the remainder of the evaluation will be completed by another provider, this initial triage assessment does not replace that evaluation, and the importance of remaining in the ED until their evaluation is complete.  Workup initiated   Michelle Piper, Cordelia Poche 11/20/23 1717

## 2023-11-20 NOTE — ED Triage Notes (Signed)
Patient reports elevated BP at 170s. Reports intermittent vision changes since 1 week ago. Patient states it feels like her vision is going dark. States vision is blurry currently.

## 2023-11-20 NOTE — Discharge Instructions (Addendum)
Follow-up with your doctor as needed.

## 2023-11-20 NOTE — ED Provider Notes (Addendum)
[facility] AT [facility] Provider Note   CSN: [id number] Arrival date & time: 11/20/23  1653     History  Chief Complaint  Patient presents with   Hypertension    Joann Townsend is a 67 y.o. female.  67 year old female presents with hypertension as well as intermittent visual changes.  States that she had loss of partial temporal vision from her right eye today was only last for couple seconds.  This is the second time that this has happened.  No other associated symptoms.  She denies any headache denies any peripheral weakness.  Denies any symptoms to her left eye.  Was unsure of the etiology of this.  Patient does have a history of hypertension and takes Benicar HCT.  States that she has been somewhat compliant with this.  Has not had her dose of this medication adjusted recently.  Due to the visual changes, was sent here for further evaluation.  Currently asymptomatic       Home Medications Prior to Admission medications   Medication Sig Start Date End Date Taking? Authorizing Provider  amLODipine (NORVASC) 5 MG tablet Take 1 tablet (5 mg total) by mouth daily. Due for an office visit. 08/08/16   Donato Schultz, DO  azelastine (ASTELIN) 0.1 % nasal spray instill 2 sprays into each nostril twice a day 12/12/17   [provider]  losartan-hydrochlorothiazide (HYZAAR) 100-25 MG tablet take 1 tablet by mouth once daily 04/22/16   Zola Button, Grayling Congress, DO  olmesartan-hydrochlorothiazide (BENICAR HCT) 40-25 MG tablet  12/31/17   [provider]  rosuvastatin (CRESTOR) 20 MG tablet Take 1 tablet (20 mg total) by mouth daily. 10/05/15   Donato Schultz, DO      Allergies    Patient has no known allergies.    Review of Systems   Review of Systems  All other systems reviewed and are negative.   Physical Exam Updated Vital Signs BP (!) 170/103 (BP Location: Right Arm)   Pulse (!) 109   Temp 98.5 F (36.9 C) (Oral)    Resp 16   Ht 1.499 m (4\' 11" )   Wt 72.6 kg   SpO2 100%   BMI 32.32 kg/m  Physical Exam Vitals and nursing note reviewed.  Constitutional:      General: She is not in acute distress.    Appearance: Normal appearance. She is well-developed. She is not toxic-appearing.  HENT:     Head: Normocephalic and atraumatic.  Eyes:     General: Lids are normal.     Extraocular Movements: Extraocular movements intact.     Right eye: Normal extraocular motion and no nystagmus.     Conjunctiva/sclera: Conjunctivae normal.     Pupils: Pupils are equal, round, and reactive to light.  Neck:     Thyroid: No thyroid mass.     Trachea: No tracheal deviation.  Cardiovascular:     Rate and Rhythm: Normal rate and regular rhythm.     Heart sounds: Normal heart sounds. No murmur heard.    No gallop.  Pulmonary:     Effort: Pulmonary effort is normal. No respiratory distress.     Breath sounds: Normal breath sounds. No stridor. No decreased breath sounds, wheezing, rhonchi or rales.  Abdominal:     General: There is no distension.     Palpations: Abdomen is soft.     Tenderness: There is no abdominal tenderness. There is no rebound.  Musculoskeletal:  General: No tenderness. Normal range of motion.     Cervical back: Normal range of motion and neck supple.  Skin:    General: Skin is warm and dry.     Findings: No abrasion or rash.  Neurological:     General: No focal deficit present.     Mental Status: She is alert and oriented to person, place, and time. Mental status is at baseline.     GCS: GCS eye subscore is 4. GCS verbal subscore is 5. GCS motor subscore is 6.     Cranial Nerves: Cranial nerves are intact. No cranial nerve deficit.     Sensory: No sensory deficit.     Motor: Motor function is intact.  Psychiatric:        Attention and Perception: Attention normal.        Speech: Speech normal.        Behavior: Behavior normal.     ED Results / Procedures / Treatments    Labs (all labs ordered are listed, but only abnormal results are displayed) Labs Reviewed  BASIC METABOLIC PANEL  CBC WITH DIFFERENTIAL/PLATELET    EKG EKG Interpretation Date/Time:  Thursday November 20 2023 17:10:32 EST Ventricular Rate:  80 PR Interval:  189 QRS Duration:  82 QT Interval:  369 QTC Calculation: 426 R Axis:   15  Text Interpretation: Sinus arrhythmia Probable left atrial enlargement Low voltage, precordial leads Baseline wander in lead(s) V4 Confirmed by Lorre Nick ([id number]) on 11/20/2023 5:43:38 PM  Radiology No results found.  Procedures Procedures    Medications Ordered in ED Medications - No data to display  ED Course/ Medical Decision Making/ A&P                                 Medical Decision Making Amount and/or Complexity of Data Reviewed Radiology: ordered.  Risk Prescription drug management.   Patient is EKG per interpretation shows normal sinus rhythm.  No signs of acute ischemic changes noted.  Patient without any focal neurological deficits at this time.  Labs are reassuring with exception of mild hypokalemia which was treated with oral potassium.  CT angio of head and neck without evidence of occlusion.  Low suspicion that patient is having TIA.  No visual changes, no eye pain.  Denies any bitemporal aspect to her vision changes.  Plan will be for discharge with follow-up with her PCP.  Blood pressure has been stable here        Final Clinical Impression(s) / ED Diagnoses Final diagnoses:  None    Rx / DC Orders ED Discharge Orders     None         Lorre Nick, MD 11/20/23 2223    Lorre Nick, MD 11/20/23 2225

## 2024-03-05 DIAGNOSIS — I10 Essential (primary) hypertension: Secondary | ICD-10-CM | POA: Diagnosis not present

## 2024-03-05 DIAGNOSIS — R739 Hyperglycemia, unspecified: Secondary | ICD-10-CM | POA: Diagnosis not present

## 2024-03-05 DIAGNOSIS — M25552 Pain in left hip: Secondary | ICD-10-CM | POA: Diagnosis not present

## 2024-03-05 DIAGNOSIS — E785 Hyperlipidemia, unspecified: Secondary | ICD-10-CM | POA: Diagnosis not present

## 2024-03-06 DIAGNOSIS — Z79899 Other long term (current) drug therapy: Secondary | ICD-10-CM | POA: Diagnosis not present

## 2024-03-06 DIAGNOSIS — Z6830 Body mass index (BMI) 30.0-30.9, adult: Secondary | ICD-10-CM | POA: Diagnosis not present

## 2024-03-06 DIAGNOSIS — M25562 Pain in left knee: Secondary | ICD-10-CM | POA: Diagnosis not present

## 2024-03-06 DIAGNOSIS — M129 Arthropathy, unspecified: Secondary | ICD-10-CM | POA: Diagnosis not present

## 2024-03-06 DIAGNOSIS — I10 Essential (primary) hypertension: Secondary | ICD-10-CM | POA: Diagnosis not present

## 2024-03-07 DIAGNOSIS — M25562 Pain in left knee: Secondary | ICD-10-CM | POA: Diagnosis not present

## 2024-03-07 DIAGNOSIS — I10 Essential (primary) hypertension: Secondary | ICD-10-CM | POA: Diagnosis not present

## 2024-03-07 DIAGNOSIS — Z6830 Body mass index (BMI) 30.0-30.9, adult: Secondary | ICD-10-CM | POA: Diagnosis not present

## 2024-03-07 DIAGNOSIS — R030 Elevated blood-pressure reading, without diagnosis of hypertension: Secondary | ICD-10-CM | POA: Diagnosis not present

## 2024-03-08 DIAGNOSIS — M25562 Pain in left knee: Secondary | ICD-10-CM | POA: Diagnosis not present

## 2024-03-09 DIAGNOSIS — E782 Mixed hyperlipidemia: Secondary | ICD-10-CM | POA: Diagnosis not present

## 2024-03-09 DIAGNOSIS — I10 Essential (primary) hypertension: Secondary | ICD-10-CM | POA: Diagnosis not present

## 2024-03-09 DIAGNOSIS — Z789 Other specified health status: Secondary | ICD-10-CM | POA: Diagnosis not present

## 2024-03-09 DIAGNOSIS — M1712 Unilateral primary osteoarthritis, left knee: Secondary | ICD-10-CM | POA: Diagnosis not present

## 2024-03-09 DIAGNOSIS — E1143 Type 2 diabetes mellitus with diabetic autonomic (poly)neuropathy: Secondary | ICD-10-CM | POA: Diagnosis not present

## 2024-03-09 DIAGNOSIS — E1165 Type 2 diabetes mellitus with hyperglycemia: Secondary | ICD-10-CM | POA: Diagnosis not present

## 2024-03-09 DIAGNOSIS — E1159 Type 2 diabetes mellitus with other circulatory complications: Secondary | ICD-10-CM | POA: Diagnosis not present

## 2024-03-09 DIAGNOSIS — E1122 Type 2 diabetes mellitus with diabetic chronic kidney disease: Secondary | ICD-10-CM | POA: Diagnosis not present

## 2024-03-09 DIAGNOSIS — I129 Hypertensive chronic kidney disease with stage 1 through stage 4 chronic kidney disease, or unspecified chronic kidney disease: Secondary | ICD-10-CM | POA: Diagnosis not present

## 2024-03-31 DIAGNOSIS — M1712 Unilateral primary osteoarthritis, left knee: Secondary | ICD-10-CM | POA: Diagnosis not present

## 2024-04-06 DIAGNOSIS — M170 Bilateral primary osteoarthritis of knee: Secondary | ICD-10-CM | POA: Diagnosis not present

## 2024-04-06 DIAGNOSIS — M545 Low back pain, unspecified: Secondary | ICD-10-CM | POA: Diagnosis not present

## 2024-04-06 DIAGNOSIS — I10 Essential (primary) hypertension: Secondary | ICD-10-CM | POA: Diagnosis not present

## 2024-04-06 DIAGNOSIS — M1712 Unilateral primary osteoarthritis, left knee: Secondary | ICD-10-CM | POA: Diagnosis not present

## 2024-04-06 DIAGNOSIS — K219 Gastro-esophageal reflux disease without esophagitis: Secondary | ICD-10-CM | POA: Diagnosis not present

## 2024-04-06 DIAGNOSIS — M1711 Unilateral primary osteoarthritis, right knee: Secondary | ICD-10-CM | POA: Diagnosis not present

## 2024-04-26 ENCOUNTER — Other Ambulatory Visit: Payer: Self-pay

## 2024-04-26 ENCOUNTER — Ambulatory Visit: Attending: Family Medicine

## 2024-04-26 DIAGNOSIS — M25561 Pain in right knee: Secondary | ICD-10-CM | POA: Insufficient documentation

## 2024-04-26 DIAGNOSIS — M25552 Pain in left hip: Secondary | ICD-10-CM | POA: Insufficient documentation

## 2024-04-26 DIAGNOSIS — G8929 Other chronic pain: Secondary | ICD-10-CM | POA: Insufficient documentation

## 2024-04-26 DIAGNOSIS — M25562 Pain in left knee: Secondary | ICD-10-CM | POA: Insufficient documentation

## 2024-04-26 DIAGNOSIS — R262 Difficulty in walking, not elsewhere classified: Secondary | ICD-10-CM | POA: Insufficient documentation

## 2024-04-26 NOTE — Therapy (Signed)
 OUTPATIENT PHYSICAL THERAPY THORACOLUMBAR EVALUATION   Patient Name: Joann Townsend MRN: [id number] DOB:December 25, 1956, 68 y.o., female Today's Date: 04/26/2024  END OF SESSION:  PT End of Session - 04/26/24 1528     Visit Number 1    Date for PT Re-Evaluation 06/21/24    Progress Note Due on Visit 10    PT Start Time 0930    PT Stop Time 1015    PT Time Calculation (min) 45 min    Activity Tolerance Patient limited by pain    Behavior During Therapy [facility] for tasks assessed/performed             Past Medical History:  Diagnosis Date   Allergy    prn allegra D   Anal fissure    Colon polyp 2004   hyperplastic   Hyperlipidemia    Hypertension    Internal hemorrhoids    Snores    uses mouth guard - no cpap    Tuberculosis 1980   pt. states she was treated for Tb after going to [place] and testing postive   Past Surgical History:  Procedure Laterality Date   ABDOMINAL HYSTERECTOMY     ANAL FISSURE REPAIR     CESAREAN SECTION     x 3   COLONOSCOPY     COLONOSCOPY W/ POLYPECTOMY     05/17/03   POLYPECTOMY     Patient Active Problem List   Diagnosis Date Noted   Obesity (BMI 30-39.9) 10/08/2013   HTN (hypertension) 03/20/2011   Disorder of bone and cartilage 06/15/2010   POSITIVE PPD 06/16/2009   HYPERLIPIDEMIA 06/24/2008   HEMOCCULT POSITIVE STOOL 06/24/2008    PCP: Rayma Calandra, MD  REFERRING PROVIDER: same  REFERRING DIAG: L SI jt pain and B knee OA, pain  Rationale for Evaluation and Treatment: Rehabilitation  THERAPY DIAG:  Pain in left hip  Chronic pain of both knees  Difficulty in walking, not elsewhere classified  ONSET DATE: L SI march 2025  SUBJECTIVE:                                                                                                                                                                                           SUBJECTIVE STATEMENT: I was active, walking every day, going to cardiac ex class, rode in car to and  from [place] , was sitting in cramped position surrounded by luggage, L SI jt pain started after that, then seemed to travel to my L knee had a shot and it helped tremendously, to have gel injections L knee 1 x week for 3 weeks, starting this week. Hoping that will help.  PERTINENT HISTORY:  As above, to have the gel injections at orthopedics  PAIN:  Are you having pain? Yes: NPRS scale: 2 to 6 Pain location: L SI, B knees  Pain description: deep , sharp pain L knee, ant knee radiating from L SI  Aggravating factors: walking, standing, sitting, chronic Relieving factors: lying down in specific position, meds  PRECAUTIONS: None  RED FLAGS: None   WEIGHT BEARING RESTRICTIONS: No  FALLS:  Has patient fallen in last 6 months? No  LIVING ENVIRONMENT: Lives with: lives with their spouse Lives in: House/apartment Stairs: No Has following equipment at home: Single point cane  OCCUPATION: retired  PLOF: Independent  PATIENT GOALS: get back to being able to walk, exercise, back to my life  NEXT MD VISIT: Wednesday, 4/30 for gel injection L knee  OBJECTIVE:  Note: Objective measures were completed at Evaluation unless otherwise noted.  DIAGNOSTIC FINDINGS:  Per md notes OA B knees  PATIENT SURVEYS:  Modified Oswestry Modified Oswestry Low Back Pain Disability Questionnaire: 35 / 50 = 70.0 %   COGNITION: Overall cognitive status: Within functional limits for tasks assessed     SENSATION: WFL  MUSCLE LENGTH:unable to test  POSTURE: maintains L knee and hip flexed, to 15 degrees, weight shifted to R, in standing, also un weights L hip in sitting.   PALPATION: Non tender B quads, patellar tendons, some tenderness L gluteals , along sI jt line, medial sacral border  LUMBAR ROM: wfl all directions today  AROM eval  Flexion   Extension   Right lateral flexion   Left lateral flexion   Right rotation   Left rotation    (Blank rows = not tested)  LOWER EXTREMITY ROM:      Active Assistive ROM Right eval Left eval  Hip flexion  100  Hip extension    Hip abduction    Hip adduction    Hip internal rotation    Hip external rotation    Knee flexion 138 125  Knee extension 0 -6  Ankle dorsiflexion    Ankle plantarflexion    Ankle inversion    Ankle eversion     (Blank rows = not tested)  LOWER EXTREMITY MMT:    MMT Right eval Left eval  Hip flexion  4-  Hip extension    Hip abduction    Hip adduction    Hip internal rotation    Hip external rotation    Knee flexion    Knee extension NT able to extend vs gravity Nt, able to extend vs gravity  Ankle dorsiflexion    Ankle plantarflexion    Ankle inversion    Ankle eversion     (Blank rows = not tested)  LUMBAR SPECIAL TESTS:  Unable, pt unable to tolerate supine position  FUNCTIONAL TESTS:  Timed up and go (TUG): 25 with st cane  GAIT: Distance walked: in clinic up to 100' Assistive device utilized: Single point cane Level of assistance: Modified independence Comments: very slow cadence, shortened stride length B, antalgic  L  TREATMENT DATE: 04/26/24: Eval, instructed in isometric hip abd/ER with rigid strap for engagement of multifidus/hip rotation/gluteals Pt using wraps B knees, also brought in 2 sleeves, advised her to contine to use wraps as they are most effective for her Adjusted her st cane for correct/ effective height  PATIENT EDUCATION:  Education details: POC< goals Person educated: Patient Education method: Explanation, Demonstration, Tactile cues, and Verbal cues Education comprehension: verbalized understanding and returned demonstration  HOME EXERCISE PROGRAM: TBD  ASSESSMENT:  CLINICAL IMPRESSION: Patient is a 68 y.o. female who evaluated today by physical therapy due to approximately 6 month history of L SI jt pain and B knee pain,  primarily L knee.  She is very guarded today with all movements, protecting L LE and lower back.  Noted full ROM lumbar, some loss of L knee flex ROM, no specific strength deficits noted B LE's.  She is going to have a series of 3 gel injections B knees, one week apart starting this week.  She wishes to have initiated the injections before returning for additional PT sessions.  She should benefit from PT  to address her pain, would try initially to address her L SI jt line pain and gluts/ piriformis pain, then progress to light movements B Knees.  OBJECTIVE IMPAIRMENTS: decreased activity tolerance, decreased endurance, decreased mobility, difficulty walking, decreased ROM, impaired perceived functional ability, impaired flexibility, prosthetic dependency , and pain.   ACTIVITY LIMITATIONS: carrying, bending, sitting, standing, squatting, stairs, transfers, and locomotion level  PARTICIPATION LIMITATIONS: meal prep, laundry, interpersonal relationship, shopping, and community activity  PERSONAL FACTORS: Behavior pattern, Past/current experiences, Time since onset of injury/illness/exacerbation, and 1-2 comorbidities: HTN  are also affecting patient's functional outcome.   REHAB POTENTIAL: Good  CLINICAL DECISION MAKING: Evolving/moderate complexity  EVALUATION COMPLEXITY: Moderate   GOALS: Goals reviewed with patient? Yes  SHORT TERM GOALS: Target date: 2 weeks 05/10/24  I HEP Baseline: Goal status: INITIAL  LONG TERM GOALS: Target date: 8 weeks 06/21/24  Improve TUG score to 14 sec or less with LAD Baseline: 25 Goal status: INITIAL  2.  Improve Modified Oswestry Modified Oswestry Low Back Pain Disability Questionnaire: 35 / 50 = 70.0 % to 50% or less Baseline:  Goal status: INITIAL  3.  Patient able to complete 11 to 14 reps in  30 sec sit to stand demonstrating LE strength and coordination wnl for her age group /gender Baseline: TBD Goal status: INITIAL  4.  Gait without device  greater than 800' for community distances and over various terrain without SI pain Baseline:  Goal status: INITIAL   PLAN:  PT FREQUENCY: 1-2x/week  PT DURATION: 8 weeks  PLANNED INTERVENTIONS: 97110-Therapeutic exercises, 97530- Therapeutic activity, [id number]- Neuromuscular re-education, 97535- Self Care, 86578- Manual therapy, and 97033- Ionotophoresis 4mg /ml Dexamethasone.  PLAN FOR NEXT SESSION: manual techniques, possibly TPDN L SI musculature, initiate/ advance gluteal strengthening    Xandria Gallaga L Quaneshia Wareing, PT, DPT, OCS 04/26/2024, 3:37 PM

## 2024-04-28 DIAGNOSIS — F411 Generalized anxiety disorder: Secondary | ICD-10-CM | POA: Diagnosis not present

## 2024-04-28 DIAGNOSIS — M170 Bilateral primary osteoarthritis of knee: Secondary | ICD-10-CM | POA: Diagnosis not present

## 2024-05-05 DIAGNOSIS — M170 Bilateral primary osteoarthritis of knee: Secondary | ICD-10-CM | POA: Diagnosis not present

## 2024-05-07 ENCOUNTER — Encounter: Payer: Self-pay | Admitting: Physical Therapy

## 2024-05-07 ENCOUNTER — Ambulatory Visit: Attending: Family Medicine | Admitting: Physical Therapy

## 2024-05-07 DIAGNOSIS — M25562 Pain in left knee: Secondary | ICD-10-CM | POA: Diagnosis not present

## 2024-05-07 DIAGNOSIS — G8929 Other chronic pain: Secondary | ICD-10-CM | POA: Diagnosis not present

## 2024-05-07 DIAGNOSIS — M25561 Pain in right knee: Secondary | ICD-10-CM | POA: Insufficient documentation

## 2024-05-07 DIAGNOSIS — R262 Difficulty in walking, not elsewhere classified: Secondary | ICD-10-CM | POA: Insufficient documentation

## 2024-05-07 DIAGNOSIS — M25552 Pain in left hip: Secondary | ICD-10-CM | POA: Diagnosis not present

## 2024-05-07 NOTE — Therapy (Signed)
 OUTPATIENT PHYSICAL THERAPY THORACOLUMBAR EVALUATION   Patient Name: Joann Townsend MRN: [id number] DOB:October 30, 1956, 68 y.o., female Today's Date: 05/07/2024  END OF SESSION:  PT End of Session - 05/07/24 1049     Visit Number 2    Date for PT Re-Evaluation 06/21/24    PT Start Time 1050    PT Stop Time 1135    PT Time Calculation (min) 45 min    Activity Tolerance Patient limited by pain    Behavior During Therapy [facility] for tasks assessed/performed             Past Medical History:  Diagnosis Date   Allergy    prn allegra D   Anal fissure    Colon polyp 2004   hyperplastic   Hyperlipidemia    Hypertension    Internal hemorrhoids    Snores    uses mouth guard - no cpap    Tuberculosis 1980   pt. states she was treated for Tb after going to [place] and testing postive   Past Surgical History:  Procedure Laterality Date   ABDOMINAL HYSTERECTOMY     ANAL FISSURE REPAIR     CESAREAN SECTION     x 3   COLONOSCOPY     COLONOSCOPY W/ POLYPECTOMY     05/17/03   POLYPECTOMY     Patient Active Problem List   Diagnosis Date Noted   Obesity (BMI 30-39.9) 10/08/2013   HTN (hypertension) 03/20/2011   Disorder of bone and cartilage 06/15/2010   POSITIVE PPD 06/16/2009   HYPERLIPIDEMIA 06/24/2008   HEMOCCULT POSITIVE STOOL 06/24/2008    PCP: Rayma Calandra, MD  REFERRING PROVIDER: same  REFERRING DIAG: L SI jt pain and B knee OA, pain  Rationale for Evaluation and Treatment: Rehabilitation  THERAPY DIAG:  Pain in left hip  Chronic pain of both knees  Difficulty in walking, not elsewhere classified  ONSET DATE: L SI march 2025  SUBJECTIVE:                                                                                                                                                                                           SUBJECTIVE STATEMENT:  Had gel shots in her knees Wednesday. Pain in the low back SI joint area on the R side   I was active, walking  every day, going to cardiac ex class, rode in car to and from [place] , was sitting in cramped position surrounded by luggage, L SI jt pain started after that, then seemed to travel to my L knee had a shot and it helped tremendously, to have gel injections L knee 1 x week  for 3 weeks, starting this week. Hoping that will help.  PERTINENT HISTORY:  As above, to have the gel injections at orthopedics  PAIN:  Are you having pain? Yes: NPRS scale: 3-4/10 Pain location: L SI, B knees  Pain description: deep , sharp pain L knee, ant knee radiating from L SI  Aggravating factors: walking, standing, sitting, chronic Relieving factors: lying down in specific position, meds  PRECAUTIONS: None  RED FLAGS: None   WEIGHT BEARING RESTRICTIONS: No  FALLS:  Has patient fallen in last 6 months? No  LIVING ENVIRONMENT: Lives with: lives with their spouse Lives in: House/apartment Stairs: No Has following equipment at home: Single point cane  OCCUPATION: retired  PLOF: Independent  PATIENT GOALS: get back to being able to walk, exercise, back to my life  NEXT MD VISIT: Wednesday, 4/30 for gel injection L knee  OBJECTIVE:  Note: Objective measures were completed at Evaluation unless otherwise noted.  DIAGNOSTIC FINDINGS:  Per md notes OA B knees  PATIENT SURVEYS:  Modified Oswestry Modified Oswestry Low Back Pain Disability Questionnaire: 35 / 50 = 70.0 %   COGNITION: Overall cognitive status: Within functional limits for tasks assessed     SENSATION: WFL  MUSCLE LENGTH:unable to test  POSTURE: maintains L knee and hip flexed, to 15 degrees, weight shifted to R, in standing, also un weights L hip in sitting.   PALPATION: Non tender B quads, patellar tendons, some tenderness L gluteals , along sI jt line, medial sacral border  LUMBAR ROM: wfl all directions today  AROM eval  Flexion   Extension   Right lateral flexion   Left lateral flexion   Right rotation   Left rotation     (Blank rows = not tested)  LOWER EXTREMITY ROM:     Active Assistive ROM Right eval Left eval  Hip flexion  100  Hip extension    Hip abduction    Hip adduction    Hip internal rotation    Hip external rotation    Knee flexion 138 125  Knee extension 0 -6  Ankle dorsiflexion    Ankle plantarflexion    Ankle inversion    Ankle eversion     (Blank rows = not tested)  LOWER EXTREMITY MMT:    MMT Right eval Left eval  Hip flexion  4-  Hip extension    Hip abduction    Hip adduction    Hip internal rotation    Hip external rotation    Knee flexion    Knee extension NT able to extend vs gravity Nt, able to extend vs gravity  Ankle dorsiflexion    Ankle plantarflexion    Ankle inversion    Ankle eversion     (Blank rows = not tested)  LUMBAR SPECIAL TESTS:  Unable, pt unable to tolerate supine position  FUNCTIONAL TESTS:  Timed up and go (TUG): 25 with st cane  GAIT: Distance walked: in clinic up to 100' Assistive device utilized: Single point cane Level of assistance: Modified independence Comments: very slow cadence, shortened stride length B, antalgic  L  TREATMENT DATE: 05/07/24 NuStep L1 x 5 min Pt unable to tolerated supine to check SI alignment despite multiple attempts with pillows and bolsters Hip add ball squeeze Hip abd green 2x10 Sit to stand 2x5 focusing on correct form and hip hinge Standing hip ext and abd 2x10 each  04/26/24: Eval, instructed in isometric hip abd/ER with rigid strap for engagement of multifidus/hip rotation/gluteals Pt using  wraps B knees, also brought in 2 sleeves, advised her to contine to use wraps as they are most effective for her Adjusted her st cane for correct/ effective height                                                                                                                                PATIENT EDUCATION:  Education details: POC< goals Person educated: Patient Education method: Explanation,  Demonstration, Tactile cues, and Verbal cues Education comprehension: verbalized understanding and returned demonstration  HOME EXERCISE PROGRAM: TBD  ASSESSMENT:  CLINICAL IMPRESSION: Patient is a 68 y.o. female who was seen today for approximately 6 month history of L SI jt pain and B knee pain, primarily L knee.  She remains very guarded today with all movements, protecting L LE and lower back.  She reports only having one more knee injection to go. Session limited due to positional issues, unable to tolerated supine position. Pt was apprehensive and guarded with all movement and activities, despite reporting no increase in pain. She should benefit from PT  to address her pain, would try initially to address her L SI jt line pain and gluts/ piriformis pain, then progress to light movements B Knees.  OBJECTIVE IMPAIRMENTS: decreased activity tolerance, decreased endurance, decreased mobility, difficulty walking, decreased ROM, impaired perceived functional ability, impaired flexibility, prosthetic dependency , and pain.   ACTIVITY LIMITATIONS: carrying, bending, sitting, standing, squatting, stairs, transfers, and locomotion level  PARTICIPATION LIMITATIONS: meal prep, laundry, interpersonal relationship, shopping, and community activity  PERSONAL FACTORS: Behavior pattern, Past/current experiences, Time since onset of injury/illness/exacerbation, and 1-2 comorbidities: HTN are also affecting patient's functional outcome.   REHAB POTENTIAL: Good  CLINICAL DECISION MAKING: Evolving/moderate complexity  EVALUATION COMPLEXITY: Moderate   GOALS: Goals reviewed with patient? Yes  SHORT TERM GOALS: Target date: 2 weeks 05/10/24  I HEP Baseline: Goal status: INITIAL  LONG TERM GOALS: Target date: 8 weeks 06/21/24  Improve TUG score to 14 sec or less with LAD Baseline: 25 Goal status: INITIAL  2.  Improve Modified Oswestry Modified Oswestry Low Back Pain Disability Questionnaire: 35 /  50 = 70.0 % to 50% or less Baseline:  Goal status: INITIAL  3.  Patient able to complete 11 to 14 reps in  30 sec sit to stand demonstrating LE strength and coordination wnl for her age group /gender Baseline: TBD Goal status: INITIAL  4.  Gait without device greater than 800' for community distances and over various terrain without SI pain Baseline:  Goal status: INITIAL   PLAN:  PT FREQUENCY: 1-2x/week  PT DURATION: 8 weeks  PLANNED INTERVENTIONS: 97110-Therapeutic exercises, 97530- Therapeutic activity, [id number]- Neuromuscular re-education, 97535- Self Care, 82956- Manual therapy, and 97033- Ionotophoresis 4mg /ml Dexamethasone.  PLAN FOR NEXT SESSION: manual techniques, possibly TPDN L SI musculature, initiate/ advance gluteal strengthening    Ollen Beverage, PTA, 05/07/2024, 10:49 AM

## 2024-05-12 DIAGNOSIS — M170 Bilateral primary osteoarthritis of knee: Secondary | ICD-10-CM | POA: Diagnosis not present

## 2024-05-12 DIAGNOSIS — I10 Essential (primary) hypertension: Secondary | ICD-10-CM | POA: Diagnosis not present

## 2024-05-14 ENCOUNTER — Ambulatory Visit: Admitting: Physical Therapy

## 2024-05-14 ENCOUNTER — Encounter: Payer: Self-pay | Admitting: Physical Therapy

## 2024-05-14 DIAGNOSIS — M25562 Pain in left knee: Secondary | ICD-10-CM | POA: Diagnosis not present

## 2024-05-14 DIAGNOSIS — R262 Difficulty in walking, not elsewhere classified: Secondary | ICD-10-CM

## 2024-05-14 DIAGNOSIS — M25561 Pain in right knee: Secondary | ICD-10-CM | POA: Diagnosis not present

## 2024-05-14 DIAGNOSIS — M25552 Pain in left hip: Secondary | ICD-10-CM | POA: Diagnosis not present

## 2024-05-14 DIAGNOSIS — G8929 Other chronic pain: Secondary | ICD-10-CM

## 2024-05-14 NOTE — Therapy (Signed)
 OUTPATIENT PHYSICAL THERAPY THORACOLUMBAR TREATMENT   Patient Name: Joann Townsend MRN: [id number] DOB:Nov 09, 1956, 68 y.o., female Today's Date: 05/14/2024  END OF SESSION:  PT End of Session - 05/14/24 1053     Visit Number 3    Date for PT Re-Evaluation 06/21/24    PT Start Time 1055    PT Stop Time 1140    PT Time Calculation (min) 45 min    Activity Tolerance Patient limited by pain    Behavior During Therapy [facility] for tasks assessed/performed             Past Medical History:  Diagnosis Date   Allergy    prn allegra D   Anal fissure    Colon polyp 2004   hyperplastic   Hyperlipidemia    Hypertension    Internal hemorrhoids    Snores    uses mouth guard - no cpap    Tuberculosis 1980   pt. states she was treated for Tb after going to [place] and testing postive   Past Surgical History:  Procedure Laterality Date   ABDOMINAL HYSTERECTOMY     ANAL FISSURE REPAIR     CESAREAN SECTION     x 3   COLONOSCOPY     COLONOSCOPY W/ POLYPECTOMY     05/17/03   POLYPECTOMY     Patient Active Problem List   Diagnosis Date Noted   Obesity (BMI 30-39.9) 10/08/2013   HTN (hypertension) 03/20/2011   Disorder of bone and cartilage 06/15/2010   POSITIVE PPD 06/16/2009   HYPERLIPIDEMIA 06/24/2008   HEMOCCULT POSITIVE STOOL 06/24/2008    PCP: Rayma Calandra, MD  REFERRING PROVIDER: same  REFERRING DIAG: L SI jt pain and B knee OA, pain  Rationale for Evaluation and Treatment: Rehabilitation  THERAPY DIAG:  Pain in left hip  Chronic pain of both knees  Difficulty in walking, not elsewhere classified  ONSET DATE: L SI march 2025  SUBJECTIVE:                                                                                                                                                                                           SUBJECTIVE STATEMENT: Was doing fine up until Wednesday, received the last round of gel shots. Don't feel bad now, knees are just  sore   I was active, walking every day, going to cardiac ex class, rode in car to and from [place] , was sitting in cramped position surrounded by luggage, L SI jt pain started after that, then seemed to travel to my L knee had a shot and it helped tremendously, to have gel injections L knee 1 x  week for 3 weeks, starting this week. Hoping that will help.  PERTINENT HISTORY:  As above, to have the gel injections at orthopedics  PAIN:  Are you having pain? Yes: NPRS scale: 3-4/10 Pain location: L SI, B knees  Pain description: deep , sharp pain L knee, ant knee radiating from L SI  Aggravating factors: walking, standing, sitting, chronic Relieving factors: lying down in specific position, meds  PRECAUTIONS: None  RED FLAGS: None   WEIGHT BEARING RESTRICTIONS: No  FALLS:  Has patient fallen in last 6 months? No  LIVING ENVIRONMENT: Lives with: lives with their spouse Lives in: House/apartment Stairs: No Has following equipment at home: Single point cane  OCCUPATION: retired  PLOF: Independent  PATIENT GOALS: get back to being able to walk, exercise, back to my life  NEXT MD VISIT: Wednesday, 4/30 for gel injection L knee  OBJECTIVE:  Note: Objective measures were completed at Evaluation unless otherwise noted.  DIAGNOSTIC FINDINGS:  Per md notes OA B knees  PATIENT SURVEYS:  Modified Oswestry Modified Oswestry Low Back Pain Disability Questionnaire: 35 / 50 = 70.0 %   COGNITION: Overall cognitive status: Within functional limits for tasks assessed     SENSATION: WFL  MUSCLE LENGTH:unable to test  POSTURE: maintains L knee and hip flexed, to 15 degrees, weight shifted to R, in standing, also un weights L hip in sitting.   PALPATION: Non tender B quads, patellar tendons, some tenderness L gluteals , along sI jt line, medial sacral border  LUMBAR ROM: wfl all directions today  AROM eval  Flexion   Extension   Right lateral flexion   Left lateral flexion    Right rotation   Left rotation    (Blank rows = not tested)  LOWER EXTREMITY ROM:     Active Assistive ROM Right eval Left eval  Hip flexion  100  Hip extension    Hip abduction    Hip adduction    Hip internal rotation    Hip external rotation    Knee flexion 138 125  Knee extension 0 -6  Ankle dorsiflexion    Ankle plantarflexion    Ankle inversion    Ankle eversion     (Blank rows = not tested)  LOWER EXTREMITY MMT:    MMT Right eval Left eval  Hip flexion  4-  Hip extension    Hip abduction    Hip adduction    Hip internal rotation    Hip external rotation    Knee flexion    Knee extension NT able to extend vs gravity Nt, able to extend vs gravity  Ankle dorsiflexion    Ankle plantarflexion    Ankle inversion    Ankle eversion     (Blank rows = not tested)  LUMBAR SPECIAL TESTS:  Unable, pt unable to tolerate supine position  FUNCTIONAL TESTS:  Timed up and go (TUG): 25 with st cane  GAIT: Distance walked: in clinic up to 100' Assistive device utilized: Single point cane Level of assistance: Modified independence Comments: very slow cadence, shortened stride length B, antalgic  L  TREATMENT DATE: 05/14/24 NuStep L3 x 7 min Hip add bal squeeze 2x10 Hip abd red 2x10 HS curls red 2x10 each LAQ 1lb 2x10  05/07/24 NuStep L1 x 5 min Pt unable to tolerated supine to check SI alignment despite multiple attempts with pillows and bolsters Hip add ball squeeze Hip abd green 2x10 Sit to stand 2x5 focusing on correct form and hip hinge  Standing hip ext and abd 2x10 each  04/26/24: Eval, instructed in isometric hip abd/ER with rigid strap for engagement of multifidus/hip rotation/gluteals Pt using wraps B knees, also brought in 2 sleeves, advised her to contine to use wraps as they are most effective for her Adjusted her st cane for correct/ effective height                                                                                                                                 PATIENT EDUCATION:  Education details: POC< goals Person educated: Patient Education method: Explanation, Demonstration, Tactile cues, and Verbal cues Education comprehension: verbalized understanding and returned demonstration  HOME EXERCISE PROGRAM: TBD  ASSESSMENT:  CLINICAL IMPRESSION: Patient is a 68 y.o. female who was seen today for approximately 6 month history of L SI jt pain and B knee pain, primarily L knee.  She enters doing well reporting that she thinks today could be her last day.  She has progressed meeting some of her functional goal sand reports a low pain rating. Reassured her of proper sit to stand postural mechanics. No issue completing today's interventions . OBJECTIVE IMPAIRMENTS: decreased activity tolerance, decreased endurance, decreased mobility, difficulty walking, decreased ROM, impaired perceived functional ability, impaired flexibility, prosthetic dependency , and pain.   ACTIVITY LIMITATIONS: carrying, bending, sitting, standing, squatting, stairs, transfers, and locomotion level  PARTICIPATION LIMITATIONS: meal prep, laundry, interpersonal relationship, shopping, and community activity  PERSONAL FACTORS: Behavior pattern, Past/current experiences, Time since onset of injury/illness/exacerbation, and 1-2 comorbidities: HTN are also affecting patient's functional outcome.   REHAB POTENTIAL: Good  CLINICAL DECISION MAKING: Evolving/moderate complexity  EVALUATION COMPLEXITY: Moderate   GOALS: Goals reviewed with patient? Yes  SHORT TERM GOALS: Target date: 2 weeks 05/10/24  I HEP Baseline: Goal status: INITIAL  LONG TERM GOALS: Target date: 8 weeks 06/21/24  Improve TUG score to 14 sec or less with LAD Baseline: 25 Goal status: Met 14 sec 05/14/24  2.  Improve Modified Oswestry Modified Oswestry Low Back Pain Disability Questionnaire: 35 / 50 = 70.0 % to 50% or less Baseline:  Goal status: 9/50 18% Met  05/14/24  3.  Patient able to complete 11 to 14 reps in  30 sec sit to stand demonstrating LE strength and coordination wnl for her age group /gender Baseline: TBD Goal status: Met  15 step in 30 sec 05/14/24  4.  Gait without device greater than 800' for community distances and over various terrain without SI pain Baseline:  Goal status: INITIAL   PLAN:  PT FREQUENCY: 1-2x/week  PT DURATION: 8 weeks  PLANNED INTERVENTIONS: 97110-Therapeutic exercises, 97530- Therapeutic activity, [id number]- Neuromuscular re-education, 97535- Self Care, 66063- Manual therapy, and 97033- Ionotophoresis 4mg /ml Dexamethasone.  PLAN FOR NEXT SESSION: manual techniques, possibly TPDN L SI musculature, initiate/ advance gluteal strengthening   PHYSICAL THERAPY DISCHARGE SUMMARY  Visits from Start of Care: 3  Patient agrees to discharge. Patient goals were partially  met. Patient is being discharged due to being pleased with the current functional level.  Donavon Fudge, PT, DPT Ollen Beverage, PTA, 05/14/2024, 10:54 AM

## 2024-05-21 ENCOUNTER — Ambulatory Visit: Admitting: Physical Therapy

## 2024-05-28 ENCOUNTER — Ambulatory Visit: Admitting: Physical Therapy

## 2024-06-09 DIAGNOSIS — M545 Low back pain, unspecified: Secondary | ICD-10-CM | POA: Diagnosis not present

## 2024-06-09 DIAGNOSIS — Z79899 Other long term (current) drug therapy: Secondary | ICD-10-CM | POA: Diagnosis not present

## 2024-06-09 DIAGNOSIS — M170 Bilateral primary osteoarthritis of knee: Secondary | ICD-10-CM | POA: Diagnosis not present

## 2024-06-09 DIAGNOSIS — I10 Essential (primary) hypertension: Secondary | ICD-10-CM | POA: Diagnosis not present

## 2024-06-25 DIAGNOSIS — Z0000 Encounter for general adult medical examination without abnormal findings: Secondary | ICD-10-CM | POA: Diagnosis not present

## 2024-06-25 DIAGNOSIS — Z13220 Encounter for screening for lipoid disorders: Secondary | ICD-10-CM | POA: Diagnosis not present

## 2024-07-06 DIAGNOSIS — Z0000 Encounter for general adult medical examination without abnormal findings: Secondary | ICD-10-CM | POA: Diagnosis not present

## 2024-07-06 DIAGNOSIS — I10 Essential (primary) hypertension: Secondary | ICD-10-CM | POA: Diagnosis not present

## 2024-07-07 ENCOUNTER — Encounter: Payer: Self-pay | Admitting: Pediatrics

## 2024-07-20 DIAGNOSIS — R739 Hyperglycemia, unspecified: Secondary | ICD-10-CM | POA: Diagnosis not present

## 2024-07-27 DIAGNOSIS — E1159 Type 2 diabetes mellitus with other circulatory complications: Secondary | ICD-10-CM | POA: Diagnosis not present

## 2024-07-27 DIAGNOSIS — I152 Hypertension secondary to endocrine disorders: Secondary | ICD-10-CM | POA: Diagnosis not present

## 2024-07-27 DIAGNOSIS — Z1331 Encounter for screening for depression: Secondary | ICD-10-CM | POA: Diagnosis not present

## 2024-07-27 DIAGNOSIS — Z0000 Encounter for general adult medical examination without abnormal findings: Secondary | ICD-10-CM | POA: Diagnosis not present

## 2024-07-27 DIAGNOSIS — E1169 Type 2 diabetes mellitus with other specified complication: Secondary | ICD-10-CM | POA: Diagnosis not present

## 2024-07-27 DIAGNOSIS — E1165 Type 2 diabetes mellitus with hyperglycemia: Secondary | ICD-10-CM | POA: Diagnosis not present

## 2024-07-27 DIAGNOSIS — Z1339 Encounter for screening examination for other mental health and behavioral disorders: Secondary | ICD-10-CM | POA: Diagnosis not present

## 2024-07-27 DIAGNOSIS — I10 Essential (primary) hypertension: Secondary | ICD-10-CM | POA: Diagnosis not present

## 2024-07-27 DIAGNOSIS — E782 Mixed hyperlipidemia: Secondary | ICD-10-CM | POA: Diagnosis not present

## 2024-07-28 ENCOUNTER — Encounter

## 2024-07-28 ENCOUNTER — Ambulatory Visit (AMBULATORY_SURGERY_CENTER)

## 2024-07-28 VITALS — Ht 61.0 in | Wt 151.0 lb

## 2024-07-28 DIAGNOSIS — Z860100 Personal history of colon polyps, unspecified: Secondary | ICD-10-CM

## 2024-07-28 MED ORDER — PLENVU 140 G PO SOLR: 1.0000 | Freq: Once | ORAL | 0 refills | Status: AC

## 2024-07-28 NOTE — Progress Notes (Signed)
 No egg or soy allergy known to patient  No issues known to pt with past sedation with any surgeries or procedures Patient denies ever being told they had issues or difficulty with intubation  No FH of Malignant Hyperthermia Pt is not on diet pills Pt is not on  home 02  Pt is not on blood thinners  Pt denies issues with constipation  No A fib or A flutter Have any cardiac testing pending-- no  LOA: independent  Prep: plenvu   Patient's chart reviewed by Norleen Schillings CNRA prior to previsit and patient appropriate for the [facility].  Previsit completed and red dot placed by patient's name on their procedure day (on provider's schedule).     PV completed with patient. Prep instructions sent via mychart and home address.

## 2024-07-29 ENCOUNTER — Other Ambulatory Visit: Payer: Self-pay | Admitting: Family Medicine

## 2024-07-29 DIAGNOSIS — Z1231 Encounter for screening mammogram for malignant neoplasm of breast: Secondary | ICD-10-CM

## 2024-08-10 NOTE — Progress Notes (Signed)
 [facility] History and Physical   Primary Care Physician:  [name], MD   Reason for Procedure:  History of adenomatous colon polyps  Plan:    Colonoscopy     HPI: Joann Townsend is a 68 y.o. female undergoing surveillance colonoscopy for history of adenomatous colon polyps.  Last colonoscopy was performed in 2019 and disclosed 348 mm tubular adenomas.  Previous colonoscopy in 2014 showed 2 tubular adenomas.  No documented family history of colon cancer or colon polyps.  Patient denies current symptoms of change in bowel habits or rectal bleeding.   Past Medical History:  Diagnosis Date   Allergy    prn allegra D   Anal fissure    Colon polyp 2004   hyperplastic   Hyperlipidemia    Hypertension    Internal hemorrhoids    Snores    uses mouth guard - no cpap    Tuberculosis 1980   pt. states she was treated for Tb after going to [place] and testing postive    Past Surgical History:  Procedure Laterality Date   ABDOMINAL HYSTERECTOMY     ANAL FISSURE REPAIR     CESAREAN SECTION     x 3   COLONOSCOPY     COLONOSCOPY W/ POLYPECTOMY     05/17/03   POLYPECTOMY      Prior to Admission medications   Medication Sig Start Date End Date Taking? Authorizing Provider  olmesartan-hydrochlorothiazide  (BENICAR HCT) 40-25 MG tablet Take 1 tablet by mouth daily. 12/31/17   [provider]  rosuvastatin  (CRESTOR ) 20 MG tablet Take 1 tablet (20 mg total) by mouth daily. 10/05/15   Antonio Cyndee Jamee SAUNDERS, DO    Current Outpatient Medications  Medication Sig Dispense Refill   olmesartan-hydrochlorothiazide  (BENICAR HCT) 40-25 MG tablet Take 1 tablet by mouth daily.  0   rosuvastatin  (CRESTOR ) 20 MG tablet Take 1 tablet (20 mg total) by mouth daily. 30 tablet 5   Current Facility-Administered Medications  Medication Dose Route Frequency Provider Last Rate Last Admin   0.9 %  sodium chloride  infusion  500 mL Intravenous Once Aneita Gist T, MD        0.9 %  sodium chloride  infusion  500 mL Intravenous Continuous Adis Sturgill, Inocente HERO, MD        Allergies as of 08/11/2024   (No Known Allergies)    Family History  Problem Relation Age of Onset   Hypertension Mother    Diabetes Mother    Heart disease Father    Alcohol abuse Father        died from   Diabetes Brother    Colon cancer Neg Hx    Colon polyps Neg Hx    Rectal cancer Neg Hx    Stomach cancer Neg Hx    Breast cancer Neg Hx     Social History   Socioeconomic History   Marital status: Married    Spouse name: Not on file   Number of children: 3   Years of education: Not on file   Highest education level: Not on file  Occupational History   Occupation: [employer]    Employer: [employer]  Tobacco Use   Smoking status: Never   Smokeless tobacco: Never  Substance and Sexual Activity   Alcohol use: No   Drug use: No   Sexual activity: Yes    Partners: Male  Other Topics Concern   Not on file  Social History Narrative   Exercise-- occasional  Social Drivers of Corporate investment banker Strain: Not on file  Food Insecurity: Not on file  Transportation Needs: Not on file  Physical Activity: Not on file  Stress: Not on file  Social Connections: Not on file  Intimate Partner Violence: Not on file    Review of Systems:  All other review of systems negative except as mentioned in the HPI.  Physical Exam: Vital signs BP (!) 160/103   Pulse (!) 103   Temp (!) 97.3 F (36.3 C)   Ht 5' 1 (1.549 m)   Wt 151 lb (68.5 kg)   SpO2 98%   BMI 28.53 kg/m   General:   Alert,  Well-developed, well-nourished, pleasant and cooperative in NAD Airway:  Mallampati 3 Lungs:  Clear throughout to auscultation.   Heart:  Regular rate and rhythm; no murmurs, clicks, rubs,  or gallops. Abdomen:  Soft, nontender and nondistended. Normal bowel sounds.   Neuro/Psych:  Normal mood and affect. A and O x 3  Inocente Hausen, MD [facility]  Gastroenterology

## 2024-08-11 ENCOUNTER — Ambulatory Visit (AMBULATORY_SURGERY_CENTER): Admitting: Pediatrics

## 2024-08-11 ENCOUNTER — Encounter: Payer: Self-pay | Admitting: Pediatrics

## 2024-08-11 VITALS — BP 136/73 | HR 63 | Temp 97.3°F | Resp 100 | Ht 61.0 in | Wt 151.0 lb

## 2024-08-11 DIAGNOSIS — D128 Benign neoplasm of rectum: Secondary | ICD-10-CM

## 2024-08-11 DIAGNOSIS — K635 Polyp of colon: Secondary | ICD-10-CM | POA: Diagnosis not present

## 2024-08-11 DIAGNOSIS — K648 Other hemorrhoids: Secondary | ICD-10-CM

## 2024-08-11 DIAGNOSIS — D123 Benign neoplasm of transverse colon: Secondary | ICD-10-CM | POA: Diagnosis not present

## 2024-08-11 DIAGNOSIS — D127 Benign neoplasm of rectosigmoid junction: Secondary | ICD-10-CM | POA: Diagnosis not present

## 2024-08-11 DIAGNOSIS — D122 Benign neoplasm of ascending colon: Secondary | ICD-10-CM | POA: Diagnosis not present

## 2024-08-11 DIAGNOSIS — Z860101 Personal history of adenomatous and serrated colon polyps: Secondary | ICD-10-CM | POA: Diagnosis not present

## 2024-08-11 DIAGNOSIS — K6389 Other specified diseases of intestine: Secondary | ICD-10-CM

## 2024-08-11 DIAGNOSIS — Z1211 Encounter for screening for malignant neoplasm of colon: Secondary | ICD-10-CM

## 2024-08-11 DIAGNOSIS — E785 Hyperlipidemia, unspecified: Secondary | ICD-10-CM | POA: Diagnosis not present

## 2024-08-11 DIAGNOSIS — K5730 Diverticulosis of large intestine without perforation or abscess without bleeding: Secondary | ICD-10-CM

## 2024-08-11 DIAGNOSIS — Z860100 Personal history of colon polyps, unspecified: Secondary | ICD-10-CM

## 2024-08-11 DIAGNOSIS — D125 Benign neoplasm of sigmoid colon: Secondary | ICD-10-CM

## 2024-08-11 DIAGNOSIS — I10 Essential (primary) hypertension: Secondary | ICD-10-CM | POA: Diagnosis not present

## 2024-08-11 MED ORDER — SODIUM CHLORIDE 0.9 % IV SOLN: 500.0000 mL | INTRAVENOUS | Status: DC

## 2024-08-11 NOTE — Patient Instructions (Addendum)
 Discharge patient to home (ambulatory).                           - Await pathology results.                           - Repeat colonoscopy for surveillance based on                            pathology results.                           - The findings and recommendations were discussed                            with the patient's family.                           - Patient has a contact number available for                            emergencies. The signs and symptoms of potential                            delayed complications were discussed with the                            patient. Return to normal activities tomorrow.                            Written discharge instructions were provided to the                            patient. Handout on polyps and diverticulosis given.     YOU HAD AN ENDOSCOPIC PROCEDURE TODAY AT THE [facility]:   Refer to the procedure report that was given to you for any specific questions about what was found during the examination.  If the procedure report does not answer your questions, please call your gastroenterologist to clarify.  If you requested that your care partner not be given the details of your procedure findings, then the procedure report has been included in a sealed envelope for you to review at your convenience later.  YOU SHOULD EXPECT: Some feelings of bloating in the abdomen. Passage of more gas than usual.  Walking can help get rid of the air that was put into your GI tract during the procedure and reduce the bloating. If you had a lower endoscopy (such as a colonoscopy or flexible sigmoidoscopy) you may notice spotting of blood in your stool or on the toilet paper. If you underwent a bowel prep for your procedure, you may not have a normal bowel movement for a few days.  Please Note:  You might notice some irritation and congestion in your nose or some drainage.  This is from the oxygen used during your procedure.  There is  no need for concern and it should clear up in a day or so.  SYMPTOMS TO REPORT IMMEDIATELY:  Following lower endoscopy (colonoscopy or flexible sigmoidoscopy):  Excessive amounts of blood in the stool  Significant tenderness or worsening of abdominal pains  Swelling of the abdomen that is new, acute  Fever of 100F or higher  For urgent or emergent issues, a gastroenterologist can be reached at any hour by calling [phone]. Do not use MyChart messaging for urgent concerns.    DIET:  We do recommend a small meal at first, but then you may proceed to your regular diet.  Drink plenty of fluids but you should avoid alcoholic beverages for 24 hours.  ACTIVITY:  You should plan to take it easy for the rest of today and you should NOT DRIVE or use heavy machinery until tomorrow (because of the sedation medicines used during the test).    FOLLOW UP: Our staff will call the number listed on your records the next business day following your procedure.  We will call around 7:15- 8:00 am to check on you and address any questions or concerns that you may have regarding the information given to you following your procedure. If we do not reach you, we will leave a message.     If any biopsies were taken you will be contacted by phone or by letter within the next 1-3 weeks.  Please call us  at [phone] if you have not heard about the biopsies in 3 weeks.    SIGNATURES/CONFIDENTIALITY: You and/or your care partner have signed paperwork which will be entered into your electronic medical record.  These signatures attest to the fact that that the information above on your After Visit Summary has been reviewed and is understood.  Full responsibility of the confidentiality of this discharge information lies with you and/or your care-partner.

## 2024-08-11 NOTE — Progress Notes (Signed)
 Report to PACU, RN, vss, BBS= Clear.

## 2024-08-11 NOTE — Progress Notes (Signed)
 Pt's states no medical or surgical changes since previsit or office visit.

## 2024-08-11 NOTE — Op Note (Signed)
 [facility] Patient Name: Joann Townsend Procedure Date: 08/11/2024 9:47 AM MRN: [id number] Endoscopist: Inocente Hausen , MD, [id number] Age: 68 Referring MD:  Date of Birth: 11-05-1956 Gender: Female Account #: [id number] Procedure:                Colonoscopy Indications:              High risk colon cancer surveillance: Personal                            history of multiple (3 or more) adenomas, Last                            colonoscopy: February 2019 Medicines:                Monitored Anesthesia Care Procedure:                Pre-Anesthesia Assessment:                           - Prior to the procedure, a History and Physical                            was performed, and patient medications and                            allergies were reviewed. The patient's tolerance of                            previous anesthesia was also reviewed. The risks                            and benefits of the procedure and the sedation                            options and risks were discussed with the patient.                            All questions were answered, and informed consent                            was obtained. Prior Anticoagulants: The patient has                            taken no anticoagulant or antiplatelet agents. ASA                            Grade Assessment: II - A patient with mild systemic                            disease. After reviewing the risks and benefits,                            the patient was deemed in satisfactory condition to  undergo the procedure.                           After obtaining informed consent, the colonoscope                            was passed under direct vision. Throughout the                            procedure, the patient's blood pressure, pulse, and                            oxygen saturations were monitored continuously. The                            Olympus Scope SN: [id number] was  introduced through                            the anus and advanced to the cecum, identified by                            appendiceal orifice and ileocecal valve. The                            colonoscopy was somewhat difficult due to                            significant looping and a tortuous colon.                            Successful completion of the procedure was aided by                            straightening and shortening the scope to obtain                            bowel loop reduction and using scope torsion. The                            patient tolerated the procedure well. The quality                            of the bowel preparation was good. The ileocecal                            valve, appendiceal orifice, and rectum were                            photographed. Scope In: 9:52:28 AM Scope Out: 10:20:03 AM Scope Withdrawal Time: 0 hours 22 minutes 12 seconds  Total Procedure Duration: 0 hours 27 minutes 35 seconds  Findings:                 The perianal and digital rectal examinations were  normal. Pertinent negatives include normal                            sphincter tone and no palpable rectal lesions.                           Multiple medium-mouthed and small-mouthed                            diverticula were found in the sigmoid colon,                            descending colon and ascending colon.                           Three sessile polyps were found in the ascending                            colon. The polyps were 3 to 4 mm in size. These                            polyps were removed with a cold biopsy forceps.                            Resection and retrieval were complete.                           A 10 mm polyp was found in the hepatic flexure. The                            polyp was pedunculated. The polyp was removed with                            a hot snare. Resection was complete, but the polyp                             tissue was only partially retrieved.                           Two sessile polyps were found in the rectum and                            sigmoid colon. The polyps were 4 mm in size. These                            polyps were removed with a cold biopsy forceps.                            Resection and retrieval were complete.                           Internal hemorrhoids were found during retroflexion. Complications:            No immediate  complications. Estimated blood loss:                            Minimal. Estimated Blood Loss:     Estimated blood loss was minimal. Impression:               - Diverticulosis in the sigmoid colon, in the                            descending colon and in the ascending colon.                           - Three 3 to 4 mm polyps in the ascending colon,                            removed with a cold biopsy forceps. Resected and                            retrieved.                           - One 10 mm polyp at the hepatic flexure, removed                            with a hot snare. Complete resection. Partial                            retrieval.                           - Two 4 mm polyps in the rectum and in the sigmoid                            colon, removed with a cold biopsy forceps. Resected                            and retrieved.                           - Internal hemorrhoids. Recommendation:           - Discharge patient to home (ambulatory).                           - Await pathology results.                           - Repeat colonoscopy for surveillance based on                            pathology results.                           - The findings and recommendations were discussed                            with  the patient's family.                           - Patient has a contact number available for                            emergencies. The signs and symptoms of potential                            delayed  complications were discussed with the                            patient. Return to normal activities tomorrow.                            Written discharge instructions were provided to the                            patient. Inocente Hausen, MD 08/11/2024 10:28:23 AM This report has been signed electronically.

## 2024-08-11 NOTE — Progress Notes (Signed)
 Called to room to assist during endoscopic procedure.  Patient ID and intended procedure confirmed with present staff. Received instructions for my participation in the procedure from the performing physician.

## 2024-08-12 ENCOUNTER — Telehealth: Payer: Self-pay

## 2024-08-12 DIAGNOSIS — I10 Essential (primary) hypertension: Secondary | ICD-10-CM | POA: Diagnosis not present

## 2024-08-12 DIAGNOSIS — K635 Polyp of colon: Secondary | ICD-10-CM | POA: Diagnosis not present

## 2024-08-12 DIAGNOSIS — E782 Mixed hyperlipidemia: Secondary | ICD-10-CM | POA: Diagnosis not present

## 2024-08-12 DIAGNOSIS — G720 Drug-induced myopathy: Secondary | ICD-10-CM | POA: Diagnosis not present

## 2024-08-12 DIAGNOSIS — Z789 Other specified health status: Secondary | ICD-10-CM | POA: Diagnosis not present

## 2024-08-12 DIAGNOSIS — Z79899 Other long term (current) drug therapy: Secondary | ICD-10-CM | POA: Diagnosis not present

## 2024-08-12 DIAGNOSIS — Z23 Encounter for immunization: Secondary | ICD-10-CM | POA: Diagnosis not present

## 2024-08-12 NOTE — Telephone Encounter (Signed)
  Follow up Call-     08/11/2024    8:40 AM  Call back number  Post procedure Call Back phone  # [phone]  Permission to leave phone message Yes     Patient questions:  Do you have a fever, pain , or abdominal swelling? No. Pain Score  0 *  Have you tolerated food without any problems? Yes.    Have you been able to return to your normal activities? Yes.    Do you have any questions about your discharge instructions: Diet   No. Medications  No. Follow up visit  No.  Do you have questions or concerns about your Care? No.  Actions: * If pain score is 4 or above: No action needed, pain <4.

## 2024-08-13 LAB — SURGICAL PATHOLOGY

## 2024-08-16 ENCOUNTER — Ambulatory Visit: Payer: Self-pay | Admitting: Pediatrics

## 2024-08-17 ENCOUNTER — Ambulatory Visit
Admission: RE | Admit: 2024-08-17 | Discharge: 2024-08-17 | Disposition: A | Discharge status: Home / Self Care | Source: Ambulatory Visit | Attending: Family Medicine | Admitting: Family Medicine

## 2024-08-17 DIAGNOSIS — Z1231 Encounter for screening mammogram for malignant neoplasm of breast: Secondary | ICD-10-CM | POA: Diagnosis not present

## 2024-09-24 DIAGNOSIS — H40053 Ocular hypertension, bilateral: Secondary | ICD-10-CM | POA: Diagnosis not present

## 2024-09-24 DIAGNOSIS — H25813 Combined forms of age-related cataract, bilateral: Secondary | ICD-10-CM | POA: Diagnosis not present

## 2024-09-24 DIAGNOSIS — H524 Presbyopia: Secondary | ICD-10-CM | POA: Diagnosis not present

## 2024-09-24 DIAGNOSIS — H35033 Hypertensive retinopathy, bilateral: Secondary | ICD-10-CM | POA: Diagnosis not present

## 2024-09-24 DIAGNOSIS — H5203 Hypermetropia, bilateral: Secondary | ICD-10-CM | POA: Diagnosis not present

## 2024-09-24 DIAGNOSIS — H43811 Vitreous degeneration, right eye: Secondary | ICD-10-CM | POA: Diagnosis not present

## 2024-09-29 ENCOUNTER — Encounter: Payer: Self-pay | Admitting: *Deleted

## 2024-09-29 NOTE — Progress Notes (Signed)
 Joann Townsend                                          MRN: [id number]   09/29/2024   The [facility] Quality Team Specialist reviewed this patient medical record for the purposes of chart review for care gap closure. The following were reviewed: chart review for care gap closure-kidney health evaluation for diabetes:eGFR  and uACR.    [facility] Quality Team

## 2025-03-03 ENCOUNTER — Ambulatory Visit (HOSPITAL_BASED_OUTPATIENT_CLINIC_OR_DEPARTMENT_OTHER): Admitting: Cardiovascular Disease
